# Patient Record
Sex: Male | Born: 1965 | Race: White | Hispanic: No | Marital: Married | Smoking: Never smoker
Health system: Southern US, Community
[De-identification: ages and names within clinical notes are randomized; demographics above are authoritative.]

## PROBLEM LIST (undated history)

## (undated) ENCOUNTER — Emergency Department (HOSPITAL_COMMUNITY): Admission: EM | Payer: No Typology Code available for payment source | Source: Home / Self Care

## (undated) DIAGNOSIS — Z9889 Other specified postprocedural states: Secondary | ICD-10-CM

## (undated) DIAGNOSIS — J349 Unspecified disorder of nose and nasal sinuses: Secondary | ICD-10-CM

## (undated) DIAGNOSIS — G479 Sleep disorder, unspecified: Secondary | ICD-10-CM

## (undated) DIAGNOSIS — R112 Nausea with vomiting, unspecified: Secondary | ICD-10-CM

## (undated) DIAGNOSIS — M1990 Unspecified osteoarthritis, unspecified site: Secondary | ICD-10-CM

## (undated) DIAGNOSIS — K219 Gastro-esophageal reflux disease without esophagitis: Secondary | ICD-10-CM

## (undated) HISTORY — PX: FINGER SURGERY: SHX640

## (undated) HISTORY — PX: HERNIA REPAIR: SHX51

---

## 2006-09-06 ENCOUNTER — Ambulatory Visit: Payer: Self-pay | Admitting: General Surgery

## 2009-02-11 ENCOUNTER — Emergency Department: Payer: Self-pay | Admitting: Unknown Physician Specialty

## 2011-05-04 ENCOUNTER — Ambulatory Visit: Payer: Self-pay | Admitting: Sports Medicine

## 2012-10-20 ENCOUNTER — Encounter (HOSPITAL_COMMUNITY): Payer: Self-pay | Admitting: Pharmacy Technician

## 2012-10-24 ENCOUNTER — Encounter (HOSPITAL_COMMUNITY): Payer: Self-pay

## 2012-10-24 ENCOUNTER — Encounter (HOSPITAL_COMMUNITY)
Admission: RE | Admit: 2012-10-24 | Discharge: 2012-10-24 | Disposition: A | Discharge status: Home / Self Care | Payer: No Typology Code available for payment source | Source: Ambulatory Visit | Attending: Orthopedic Surgery | Admitting: Orthopedic Surgery

## 2012-10-24 HISTORY — DX: Unspecified disorder of nose and nasal sinuses: J34.9

## 2012-10-24 HISTORY — DX: Gastro-esophageal reflux disease without esophagitis: K21.9

## 2012-10-24 HISTORY — DX: Sleep disorder, unspecified: G47.9

## 2012-10-24 HISTORY — DX: Unspecified osteoarthritis, unspecified site: M19.90

## 2012-10-24 HISTORY — DX: Nausea with vomiting, unspecified: R11.2

## 2012-10-24 HISTORY — DX: Other specified postprocedural states: Z98.890

## 2012-10-24 LAB — URINALYSIS, ROUTINE W REFLEX MICROSCOPIC
Bilirubin Urine: NEGATIVE
Glucose, UA: NEGATIVE mg/dL
Hgb urine dipstick: NEGATIVE
Ketones, ur: NEGATIVE mg/dL
Protein, ur: NEGATIVE mg/dL

## 2012-10-24 LAB — SURGICAL PCR SCREEN: MRSA, PCR: NEGATIVE

## 2012-10-24 LAB — APTT: aPTT: 30 seconds (ref 24–37)

## 2012-10-24 NOTE — Patient Instructions (Addendum)
Daniel Middleton  10/24/2012                           YOUR PROCEDURE IS SCHEDULED ON:  11/01/12               PLEASE REPORT TO SHORT STAY CENTER AT :  11:30 AM               CALL THIS NUMBER IF ANY PROBLEMS THE DAY OF SURGERY :               [phone]                      REMEMBER:   Do not eat food or drink liquids AFTER MIDNIGHT  May have clear liquids UNTIL 6 HOURS BEFORE SURGERY  (8:00 AM)  Clear liquids include soda, tea, black coffee, apple or grape juice, broth.  Take these medicines the morning of surgery with A SIP OF WATER:  NONE   Do not wear jewelry, make-up   Do not wear lotions, powders, or perfumes.   Do not shave legs or underarms 12 hrs. before surgery (men may shave face)  Do not bring valuables to the hospital.  Contacts, dentures or bridgework may not be worn into surgery.  Leave suitcase in the car. After surgery it may be brought to your room.  For patients admitted to the hospital more than one night, checkout time is 11:00                          The day of discharge.   Patients discharged the day of surgery will not be allowed to drive home                             If going home same day of surgery, must have someone stay with you first                           24 hrs at home and arrange for some one to drive you home from hospital.    Special Instructions:   Please read over the following fact sheets that you were given:               1. MRSA  INFORMATION                      2. [place] PREPARING FOR SURGERY SHEET                3. INCENTIVE SPIROMETER                                                X_____________________________________________________________________        Failure to follow these instructions may result in cancellation of your surgery

## 2012-10-25 NOTE — H&P (Signed)
TOTAL HIP ADMISSION H&P  Patient is admitted for left total hip arthroplasty, anterior approach.  Subjective:  Chief Complaint:  Left hip OA / pain  HPI: Daniel Middleton, 47 y.o. male, has a history of pain and functional disability in the left hip(s) due to arthritis and patient has failed non-surgical conservative treatments for greater than 12 weeks to include NSAID's and/or analgesics, corticosteriod injections and activity modification.  Onset of symptoms was gradual starting 3 years ago with gradually worsening course since that time.The patient noted no past surgery on the left hip(s).  Patient currently rates pain in the left hip at 8 out of 10 with activity. Patient has night pain, worsening of pain with activity and weight bearing, pain that interfers with activities of daily living and pain with passive range of motion. Patient has evidence of periarticular osteophytes and joint space narrowing by imaging studies. This condition presents safety issues increasing the risk of falls. There is no current active infection.  Risks, benefits and expectations were discussed with the patient. Patient understand the risks, benefits and expectations and wishes to proceed with surgery.   D/C Plans:   Home with HHPT  Post-op Meds:    Rx given for ASA, Robaxin, Iron, Colace and MiraLax  Tranexamic Acid:   To be given  Decadron:    To be given  FYI:   Nothing to note    Past Medical History  Diagnosis Date  . PONV (postoperative nausea and vomiting)   . Sinus trouble   . Arthritis   . GERD (gastroesophageal reflux disease)   . Difficulty sleeping     Past Surgical History  Procedure Date  . Hernia repair   . Finger surgery      No Known Allergies   History  Substance Use Topics  . Smoking status: Never Smoker   . Smokeless tobacco: Not on file  . Alcohol Use: Yes     Comment: rare       Review of Systems  Constitutional: Negative.   HENT: Negative.   Eyes: Negative.     Respiratory: Negative.   Cardiovascular: Negative.   Gastrointestinal: Positive for heartburn.  Genitourinary: Negative.   Musculoskeletal: Positive for joint pain.  Skin: Negative.   Neurological: Negative.   Endo/Heme/Allergies: Negative.   Psychiatric/Behavioral: Negative.     Objective:  Physical Exam  Constitutional: He is oriented to person, place, and time. He appears well-developed and well-nourished.  HENT:  Head: Normocephalic and atraumatic.  Mouth/Throat: Oropharynx is clear and moist.  Eyes: Pupils are equal, round, and reactive to light.  Neck: Neck supple. No JVD present. No tracheal deviation present. No thyromegaly present.  Cardiovascular: Normal rate, regular rhythm, normal heart sounds and intact distal pulses.   Respiratory: Effort normal and breath sounds normal. No stridor. No respiratory distress. He has no wheezes.  GI: Soft. There is no tenderness. There is no guarding.  Musculoskeletal:       Left hip: He exhibits decreased range of motion, decreased strength, tenderness and bony tenderness. He exhibits no swelling, no deformity and no laceration.  Lymphadenopathy:    He has no cervical adenopathy.  Neurological: He is alert and oriented to person, place, and time.  Skin: Skin is warm and dry.  Psychiatric: He has a normal mood and affect.     Imaging Review Plain radiographs demonstrate severe degenerative joint disease of the left hip(s). The bone quality appears to be good for age and reported activity level.  Assessment/Plan:  End stage arthritis, left hip(s)  The patient history, physical examination, clinical judgement of the provider and imaging studies are consistent with end stage degenerative joint disease of the left hip(s) and total hip arthroplasty is deemed medically necessary. The treatment options including medical management, injection therapy, arthroscopy and arthroplasty were discussed at length. The risks and benefits of total  hip arthroplasty were presented and reviewed. The risks due to aseptic loosening, infection, stiffness, dislocation/subluxation,  thromboembolic complications and other imponderables were discussed.  The patient acknowledged the explanation, agreed to proceed with the plan and consent was signed. Patient is being admitted for inpatient treatment for surgery, pain control, PT, OT, prophylactic antibiotics, VTE prophylaxis, progressive ambulation and ADL's and discharge planning.The patient is planning to be discharged home with home health services.    Daniel Middleton   PAC  10/25/2012, 6:57 PM

## 2012-10-26 NOTE — Progress Notes (Signed)
Pt notified of time change to 1:30 pm - to be at Short Stay at 11:00 am / clear liq until 7:30 am

## 2012-11-01 ENCOUNTER — Inpatient Hospital Stay (HOSPITAL_COMMUNITY)
Admission: RE | Admit: 2012-11-01 | Discharge: 2012-11-02 | DRG: 470 | Disposition: A | Discharge status: Home Health | Payer: No Typology Code available for payment source | Source: Ambulatory Visit | Attending: Orthopedic Surgery | Admitting: Orthopedic Surgery

## 2012-11-01 ENCOUNTER — Encounter (HOSPITAL_COMMUNITY): Payer: Self-pay | Admitting: Orthopedic Surgery

## 2012-11-01 ENCOUNTER — Inpatient Hospital Stay (HOSPITAL_COMMUNITY): Payer: No Typology Code available for payment source | Admitting: Anesthesiology

## 2012-11-01 ENCOUNTER — Encounter (HOSPITAL_COMMUNITY)
Admission: RE | Disposition: A | Discharge status: Home Health | Payer: Self-pay | Source: Ambulatory Visit | Attending: Orthopedic Surgery

## 2012-11-01 ENCOUNTER — Encounter (HOSPITAL_COMMUNITY): Payer: Self-pay | Admitting: Anesthesiology

## 2012-11-01 ENCOUNTER — Inpatient Hospital Stay (HOSPITAL_COMMUNITY): Payer: No Typology Code available for payment source

## 2012-11-01 ENCOUNTER — Encounter (HOSPITAL_COMMUNITY): Payer: Self-pay | Admitting: *Deleted

## 2012-11-01 DIAGNOSIS — Z01812 Encounter for preprocedural laboratory examination: Secondary | ICD-10-CM

## 2012-11-01 DIAGNOSIS — M1610 Unilateral primary osteoarthritis, unspecified hip: Principal | ICD-10-CM | POA: Diagnosis present

## 2012-11-01 DIAGNOSIS — Z96649 Presence of unspecified artificial hip joint: Secondary | ICD-10-CM

## 2012-11-01 DIAGNOSIS — M169 Osteoarthritis of hip, unspecified: Principal | ICD-10-CM | POA: Diagnosis present

## 2012-11-01 DIAGNOSIS — K219 Gastro-esophageal reflux disease without esophagitis: Secondary | ICD-10-CM | POA: Diagnosis present

## 2012-11-01 HISTORY — PX: TOTAL HIP ARTHROPLASTY: SHX124

## 2012-11-01 LAB — TYPE AND SCREEN: Antibody Screen: NEGATIVE

## 2012-11-01 SURGERY — ARTHROPLASTY, HIP, TOTAL, ANTERIOR APPROACH
Anesthesia: General | Site: Hip | Laterality: Left | Wound class: Clean

## 2012-11-01 MED ORDER — PROPOFOL 10 MG/ML IV BOLUS
INTRAVENOUS | Status: DC | PRN
  Administered 2012-11-01: 200 mg via INTRAVENOUS
  Administered 2012-11-01: 50 mg via INTRAVENOUS

## 2012-11-01 MED ORDER — NEOSTIGMINE METHYLSULFATE 1 MG/ML IJ SOLN
INTRAMUSCULAR | Status: DC | PRN
  Administered 2012-11-01: 4 mg via INTRAVENOUS

## 2012-11-01 MED ORDER — ONDANSETRON HCL 4 MG/2ML IJ SOLN: 4.0000 mg | Freq: Four times a day (QID) | INTRAMUSCULAR | Status: DC | PRN

## 2012-11-01 MED ORDER — ONDANSETRON HCL 4 MG/2ML IJ SOLN
INTRAMUSCULAR | Status: DC | PRN
  Administered 2012-11-01: 4 mg via INTRAVENOUS

## 2012-11-01 MED ORDER — MENTHOL 3 MG MT LOZG
1.0000 | LOZENGE | OROMUCOSAL | Status: DC | PRN
  Filled 2012-11-01 (×2): qty 9

## 2012-11-01 MED ORDER — FERROUS SULFATE 325 (65 FE) MG PO TABS
325.0000 mg | ORAL_TABLET | Freq: Three times a day (TID) | ORAL | Status: DC
  Administered 2012-11-02 (×2): 325 mg via ORAL
  Filled 2012-11-01 (×5): qty 1

## 2012-11-01 MED ORDER — DIPHENHYDRAMINE HCL 12.5 MG/5ML PO ELIX: 25.0000 mg | ORAL_SOLUTION | Freq: Four times a day (QID) | ORAL | Status: DC | PRN

## 2012-11-01 MED ORDER — LIDOCAINE HCL (CARDIAC) 20 MG/ML IV SOLN
INTRAVENOUS | Status: DC | PRN
  Administered 2012-11-01: 50 mg via INTRAVENOUS

## 2012-11-01 MED ORDER — CHLORHEXIDINE GLUCONATE 4 % EX LIQD: 60.0000 mL | Freq: Once | CUTANEOUS | Status: DC

## 2012-11-01 MED ORDER — ZOLPIDEM TARTRATE 5 MG PO TABS: 5.0000 mg | ORAL_TABLET | Freq: Every evening | ORAL | Status: DC | PRN

## 2012-11-01 MED ORDER — HYDROMORPHONE HCL PF 1 MG/ML IJ SOLN
0.2500 mg | INTRAMUSCULAR | Status: DC | PRN
  Administered 2012-11-01 (×4): 0.5 mg via INTRAVENOUS

## 2012-11-01 MED ORDER — PHENOL 1.4 % MT LIQD
1.0000 | OROMUCOSAL | Status: DC | PRN
  Filled 2012-11-01: qty 177

## 2012-11-01 MED ORDER — HYDROMORPHONE HCL PF 1 MG/ML IJ SOLN
0.5000 mg | INTRAMUSCULAR | Status: DC | PRN
  Administered 2012-11-01 (×2): 0.5 mg via INTRAVENOUS
  Filled 2012-11-01 (×2): qty 1

## 2012-11-01 MED ORDER — CEFAZOLIN SODIUM-DEXTROSE 2-3 GM-% IV SOLR
2.0000 g | INTRAVENOUS | Status: AC
  Administered 2012-11-01: 2 g via INTRAVENOUS

## 2012-11-01 MED ORDER — PROMETHAZINE HCL 25 MG/ML IJ SOLN: 6.2500 mg | INTRAMUSCULAR | Status: DC | PRN

## 2012-11-01 MED ORDER — SODIUM CHLORIDE 0.9 % IV SOLN
INTRAVENOUS | Status: DC
  Administered 2012-11-01 – 2012-11-02 (×2): via INTRAVENOUS
  Filled 2012-11-01 (×5): qty 1000

## 2012-11-01 MED ORDER — TRANEXAMIC ACID 100 MG/ML IV SOLN
1450.0000 mg | Freq: Once | INTRAVENOUS | Status: AC
  Administered 2012-11-01: 1449 mg via INTRAVENOUS
  Filled 2012-11-01: qty 14.5

## 2012-11-01 MED ORDER — LACTATED RINGERS IV SOLN
INTRAVENOUS | Status: DC | PRN
  Administered 2012-11-01 (×3): via INTRAVENOUS

## 2012-11-01 MED ORDER — DEXAMETHASONE SODIUM PHOSPHATE 10 MG/ML IJ SOLN
10.0000 mg | Freq: Once | INTRAMUSCULAR | Status: AC
  Administered 2012-11-02: 10 mg via INTRAVENOUS
  Filled 2012-11-01: qty 1

## 2012-11-01 MED ORDER — METHOCARBAMOL 500 MG PO TABS
500.0000 mg | ORAL_TABLET | Freq: Four times a day (QID) | ORAL | Status: DC | PRN
  Administered 2012-11-01 – 2012-11-02 (×2): 500 mg via ORAL
  Filled 2012-11-01 (×2): qty 1

## 2012-11-01 MED ORDER — ALUM & MAG HYDROXIDE-SIMETH 200-200-20 MG/5ML PO SUSP: 30.0000 mL | ORAL | Status: DC | PRN

## 2012-11-01 MED ORDER — METHOCARBAMOL 100 MG/ML IJ SOLN
500.0000 mg | Freq: Four times a day (QID) | INTRAVENOUS | Status: DC | PRN
  Administered 2012-11-01: 500 mg via INTRAVENOUS
  Filled 2012-11-01: qty 5

## 2012-11-01 MED ORDER — CEFAZOLIN SODIUM-DEXTROSE 2-3 GM-% IV SOLR
2.0000 g | Freq: Four times a day (QID) | INTRAVENOUS | Status: AC
  Administered 2012-11-01 (×2): 2 g via INTRAVENOUS
  Filled 2012-11-01 (×2): qty 50

## 2012-11-01 MED ORDER — HYDROMORPHONE HCL PF 1 MG/ML IJ SOLN: 0.2000 mg | INTRAMUSCULAR | Status: DC | PRN

## 2012-11-01 MED ORDER — METOCLOPRAMIDE HCL 5 MG/ML IJ SOLN
INTRAMUSCULAR | Status: DC | PRN
  Administered 2012-11-01: 10 mg via INTRAVENOUS

## 2012-11-01 MED ORDER — MIDAZOLAM HCL 5 MG/5ML IJ SOLN
INTRAMUSCULAR | Status: DC | PRN
  Administered 2012-11-01: 2 mg via INTRAVENOUS

## 2012-11-01 MED ORDER — ROCURONIUM BROMIDE 100 MG/10ML IV SOLN
INTRAVENOUS | Status: DC | PRN
  Administered 2012-11-01 (×2): 10 mg via INTRAVENOUS
  Administered 2012-11-01: 50 mg via INTRAVENOUS

## 2012-11-01 MED ORDER — LIDOCAINE HCL 4 % MT SOLN
OROMUCOSAL | Status: DC | PRN
  Administered 2012-11-01: 4 mL via TOPICAL

## 2012-11-01 MED ORDER — STERILE WATER FOR IRRIGATION IR SOLN
Status: DC | PRN
  Administered 2012-11-01: 3000 mL

## 2012-11-01 MED ORDER — ALPRAZOLAM 0.25 MG PO TABS: 0.2500 mg | ORAL_TABLET | Freq: Once | ORAL | Status: DC

## 2012-11-01 MED ORDER — METOCLOPRAMIDE HCL 5 MG/ML IJ SOLN: 10.0000 mg | Freq: Four times a day (QID) | INTRAMUSCULAR | Status: DC | PRN

## 2012-11-01 MED ORDER — RIVAROXABAN 10 MG PO TABS
10.0000 mg | ORAL_TABLET | Freq: Every day | ORAL | Status: DC
  Administered 2012-11-02: 10 mg via ORAL
  Filled 2012-11-01 (×3): qty 1

## 2012-11-01 MED ORDER — ONDANSETRON HCL 4 MG PO TABS: 4.0000 mg | ORAL_TABLET | Freq: Four times a day (QID) | ORAL | Status: DC | PRN

## 2012-11-01 MED ORDER — DEXAMETHASONE SODIUM PHOSPHATE 10 MG/ML IJ SOLN
INTRAMUSCULAR | Status: DC | PRN
  Administered 2012-11-01: 10 mg via INTRAVENOUS

## 2012-11-01 MED ORDER — HYDROMORPHONE HCL PF 1 MG/ML IJ SOLN
INTRAMUSCULAR | Status: DC | PRN
  Administered 2012-11-01: 1 mg via INTRAVENOUS

## 2012-11-01 MED ORDER — SUFENTANIL CITRATE 50 MCG/ML IV SOLN
INTRAVENOUS | Status: DC | PRN
  Administered 2012-11-01: 15 ug via INTRAVENOUS
  Administered 2012-11-01: 5 ug via INTRAVENOUS
  Administered 2012-11-01 (×2): 10 ug via INTRAVENOUS
  Administered 2012-11-01 (×2): 5 ug via INTRAVENOUS
  Administered 2012-11-01: 10 ug via INTRAVENOUS
  Administered 2012-11-01: 5 ug via INTRAVENOUS

## 2012-11-01 MED ORDER — HYDROCODONE-ACETAMINOPHEN 7.5-325 MG PO TABS
1.0000 | ORAL_TABLET | ORAL | Status: DC
  Administered 2012-11-01: 1 via ORAL
  Filled 2012-11-01: qty 2

## 2012-11-01 MED ORDER — DEXAMETHASONE SODIUM PHOSPHATE 10 MG/ML IJ SOLN: 10.0000 mg | Freq: Once | INTRAMUSCULAR | Status: DC

## 2012-11-01 MED ORDER — ACETAMINOPHEN 10 MG/ML IV SOLN
INTRAVENOUS | Status: DC | PRN
  Administered 2012-11-01: 1000 mg via INTRAVENOUS

## 2012-11-01 MED ORDER — OXYCODONE HCL 5 MG PO TABS
5.0000 mg | ORAL_TABLET | ORAL | Status: DC | PRN
  Administered 2012-11-01 – 2012-11-02 (×2): 10 mg via ORAL
  Filled 2012-11-01 (×2): qty 2

## 2012-11-01 MED ORDER — GLYCOPYRROLATE 0.2 MG/ML IJ SOLN
INTRAMUSCULAR | Status: DC | PRN
  Administered 2012-11-01: .6 mg via INTRAVENOUS

## 2012-11-01 MED ORDER — 0.9 % SODIUM CHLORIDE (POUR BTL) OPTIME
TOPICAL | Status: DC | PRN
  Administered 2012-11-01: 1000 mL

## 2012-11-01 SURGICAL SUPPLY — 37 items
BAG ZIPLOCK 12X15 (MISCELLANEOUS) ×4 IMPLANT
BLADE SAW SGTL 18X1.27X75 (BLADE) ×2 IMPLANT
CLOTH BEACON ORANGE TIMEOUT ST (SAFETY) ×2 IMPLANT
DERMABOND ADVANCED (GAUZE/BANDAGES/DRESSINGS) ×1
DERMABOND ADVANCED .7 DNX12 (GAUZE/BANDAGES/DRESSINGS) ×1 IMPLANT
DRAPE C-ARM 42X72 X-RAY (DRAPES) ×2 IMPLANT
DRAPE STERI IOBAN 125X83 (DRAPES) ×2 IMPLANT
DRAPE U-SHAPE 47X51 STRL (DRAPES) ×6 IMPLANT
DRSG AQUACEL AG ADV 3.5X10 (GAUZE/BANDAGES/DRESSINGS) ×2 IMPLANT
DRSG TEGADERM 4X4.75 (GAUZE/BANDAGES/DRESSINGS) ×2 IMPLANT
DURAPREP 26ML APPLICATOR (WOUND CARE) ×2 IMPLANT
ELECT BLADE TIP CTD 4 INCH (ELECTRODE) ×2 IMPLANT
ELECT REM PT RETURN 9FT ADLT (ELECTROSURGICAL) ×2
ELECTRODE REM PT RTRN 9FT ADLT (ELECTROSURGICAL) ×1 IMPLANT
EVACUATOR 1/8 PVC DRAIN (DRAIN) IMPLANT
FACESHIELD LNG OPTICON STERILE (SAFETY) ×8 IMPLANT
GAUZE SPONGE 2X2 8PLY STRL LF (GAUZE/BANDAGES/DRESSINGS) ×1 IMPLANT
GLOVE BIOGEL PI IND STRL 7.5 (GLOVE) ×1 IMPLANT
GLOVE BIOGEL PI IND STRL 8 (GLOVE) ×1 IMPLANT
GLOVE BIOGEL PI INDICATOR 7.5 (GLOVE) ×1
GLOVE BIOGEL PI INDICATOR 8 (GLOVE) ×1
GLOVE ECLIPSE 8.0 STRL XLNG CF (GLOVE) ×2 IMPLANT
GLOVE ORTHO TXT STRL SZ7.5 (GLOVE) ×4 IMPLANT
GOWN BRE IMP PREV XXLGXLNG (GOWN DISPOSABLE) ×4 IMPLANT
GOWN STRL NON-REIN LRG LVL3 (GOWN DISPOSABLE) ×2 IMPLANT
KIT BASIN OR (CUSTOM PROCEDURE TRAY) ×2 IMPLANT
PACK TOTAL JOINT (CUSTOM PROCEDURE TRAY) ×2 IMPLANT
PADDING CAST COTTON 6X4 STRL (CAST SUPPLIES) ×2 IMPLANT
SPONGE GAUZE 2X2 STER 10/PKG (GAUZE/BANDAGES/DRESSINGS) ×1
SUCTION FRAZIER 12FR DISP (SUCTIONS) ×2 IMPLANT
SUT MNCRL AB 4-0 PS2 18 (SUTURE) ×2 IMPLANT
SUT VIC AB 1 CT1 36 (SUTURE) ×8 IMPLANT
SUT VIC AB 2-0 CT1 27 (SUTURE) ×2
SUT VIC AB 2-0 CT1 TAPERPNT 27 (SUTURE) ×2 IMPLANT
SUT VLOC 180 0 24IN GS25 (SUTURE) ×2 IMPLANT
TOWEL OR 17X26 10 PK STRL BLUE (TOWEL DISPOSABLE) ×4 IMPLANT
TRAY FOLEY CATH 14FRSI W/METER (CATHETERS) ×2 IMPLANT

## 2012-11-01 NOTE — Anesthesia Postprocedure Evaluation (Signed)
  Anesthesia Post-op Note  Patient: Daniel Middleton  Procedure(s) Performed: Procedure(s) (LRB): TOTAL HIP ARTHROPLASTY ANTERIOR APPROACH (Left)  Patient Location: PACU  Anesthesia Type: General  Level of Consciousness: awake and alert   Airway and Oxygen Therapy: Patient Spontanous Breathing  Post-op Pain: mild  Post-op Assessment: Post-op Vital signs reviewed, Patient's Cardiovascular Status Stable, Respiratory Function Stable, Patent Airway and No signs of Nausea or vomiting  Last Vitals:  Filed Vitals:   11/01/12 1230  BP: 115/78  Pulse: 79  Temp:   Resp: 10    Post-op Vital Signs: stable   Complications: No apparent anesthesia complications

## 2012-11-01 NOTE — Op Note (Signed)
NAME:  Daniel Middleton                ACCOUNT NO.: [id number]      MEDICAL RECORD NO.: [id number]      FACILITY:  [facility]      PHYSICIAN:  Durene Romans D  DATE OF BIRTH:  01-May-1966     DATE OF PROCEDURE:  11/01/2012                                 OPERATIVE REPORT         PREOPERATIVE DIAGNOSIS: Left  hip osteoarthritis.      POSTOPERATIVE DIAGNOSIS:  Left hip osteoarthritis.      PROCEDURE:  Left total hip replacement through an anterior approach   utilizing DePuy THR system, component size 54mm pinnacle cup, a size 36+4 neutral   Altrex liner, a size 6 Hi Tri Lock stem with a 36+1.5 delta ceramic   ball.      SURGEON:  Madlyn Frankel. Charlann Boxer, M.D.      ASSISTANT:  Leilani Able, PA-C      ANESTHESIA:  General.      SPECIMENS:  None.      COMPLICATIONS:  None.      BLOOD LOSS:  500 cc     DRAINS:  One Hemovac.      INDICATION OF THE PROCEDURE:  Daniel Middleton is a 47 y.o. male who had   presented to office for evaluation of left hip pain.  Radiographs revealed   progressive degenerative changes with bone-on-bone   articulation to the  hip joint.  The patient had painful limited range of   motion significantly affecting their overall quality of life.  The patient was failing to    respond to conservative measures, and at this point was ready   to proceed with more definitive measures.  The patient has noted progressive   degenerative changes in his hip, progressive problems and dysfunction   with regarding the hip prior to surgery.  Consent was obtained for   benefit of pain relief.  Specific risk of infection, DVT, component   failure, dislocation, need for revision surgery, as well discussion of   the anterior versus posterior approach were reviewed.  Consent was   obtained for benefit of anterior pain relief through an anterior   approach.      PROCEDURE IN DETAIL:  The patient was brought to operative theater.   Once adequate anesthesia,  preoperative antibiotics, 2gm Ancef administered.   The patient was positioned supine on the OSI Hanna table.  Once adequate   padding of boney process was carried out, we had predraped out the hip, and  used fluoroscopy to confirm orientation of the pelvis and position.      The left hip was then prepped and draped from proximal iliac crest to   mid thigh with shower curtain technique.      Time-out was performed identifying the patient, planned procedure, and   extremity.     An incision was then made 2 cm distal and lateral to the   anterior superior iliac spine extending over the orientation of the   tensor fascia lata muscle and sharp dissection was carried down to the   fascia of the muscle and protractor placed in the soft tissues.      The fascia was then incised.  The muscle belly was identified and swept  laterally and retractor placed along the superior neck.  Following   cauterization of the circumflex vessels and removing some pericapsular   fat, a second cobra retractor was placed on the inferior neck.  A third   retractor was placed on the anterior acetabulum after elevating the   anterior rectus.  A L-capsulotomy was along the line of the   superior neck to the trochanteric fossa, then extended proximally and   distally.  Tag sutures were placed and the retractors were then placed   intracapsular.  We then identified the trochanteric fossa and   orientation of my neck cut, confirmed this radiographically   and then made a neck osteotomy with the femur on traction.  The femoral   head was removed without difficulty or complication.  Traction was let   off and retractors were placed posterior and anterior around the   acetabulum.      The labrum and foveal tissue were debrided.  I began reaming with a 47mm   reamer and reamed up to 53mm reamer with good bony bed preparation and a 54   cup was chosen.  The final 54mm Pinnacle cup was then impacted under fluoroscopy  to  confirm the depth of penetration and orientation with respect to   abduction.  A screw was placed followed by the hole eliminator.  The final   36+4 neutral Altrex liner was impacted with good visualized rim fit.  The cup was positioned anatomically within the acetabular portion of the pelvis.      At this point, the femur was rolled at 80 degrees.  Further capsule was   released off the inferior aspect of the femoral neck.  I then   released the superior capsule proximally.  The hook was placed laterally   along the femur and elevated manually and held in position with the bed   hook.  The leg was then extended and adducted with the leg rolled to 100   degrees of external rotation.  Once the proximal femur was fully   exposed, I used a box osteotome to set orientation.  I then began   broaching with the starting chili pepper broach and passed this by hand and then broached up to 6.  With the 6 broach in place I chose a high offset neck and did a trial reduction with the 36+1.5.  The offset was appropriate, leg lengths   appeared to be equal, confirmed radiographically.   Given these findings, I went ahead and dislocated the hip, repositioned all   retractors and positioned the right hip in the extended and abducted position.  The final 6 Hi Tri Lock stem was   chosen and it was impacted down to the level of neck cut.  Based on this   and the trial reduction, a 36+1.5 delta ceramic ball was chosen and   impacted onto a clean and dry trunnion, and the hip was reduced.  The   hip had been irrigated throughout the case again at this point.  I did   reapproximate the superior capsular leaflet to the anterior leaflet   using #1 Vicryl, placed a medium Hemovac drain deep.  The fascia of the   tensor fascia lata muscle was then reapproximated using #1 Vicryl.  The   remaining wound was closed with 2-0 Vicryl and running 4-0 Monocryl.   The hip was cleaned, dried, and dressed sterilely using  Dermabond and   Aquacel dressing.  Drain site dressed separately.  She was then brought   to recovery room in stable condition tolerating the procedure well.    Leilani Able, PA-C was present for the entirety of the case involved from   preoperative positioning, perioperative retractor management, general   facilitation of the case, as well as primary wound closure as assistant.            Madlyn Frankel Charlann Boxer, M.D.            MDO/MEDQ  D:  07/28/2011  T:  07/28/2011  Job:  [id number]      Electronically Signed by Durene Romans M.D. on 08/03/2011 09:15:38 AM

## 2012-11-01 NOTE — Anesthesia Preprocedure Evaluation (Addendum)
Anesthesia Evaluation  Patient identified by MRN, date of birth, ID band Patient awake    Reviewed: Allergy & Precautions, H&P , NPO status , Patient's Chart, lab work & pertinent test results, reviewed documented beta blocker date and time   History of Anesthesia Complications (+) PONV  Airway Mallampati: II TM Distance: >3 FB Neck ROM: full    Dental No notable dental hx.    Pulmonary neg pulmonary ROS,  breath sounds clear to auscultation  Pulmonary exam normal       Cardiovascular Exercise Tolerance: Good negative cardio ROS  Rhythm:regular Rate:Normal     Neuro/Psych negative neurological ROS  negative psych ROS   GI/Hepatic Neg liver ROS, GERD-  ,  Endo/Other  negative endocrine ROS  Renal/GU negative Renal ROS  negative genitourinary   Musculoskeletal   Abdominal   Peds  Hematology negative hematology ROS (+)   Anesthesia Other Findings   Reproductive/Obstetrics negative OB ROS                           Anesthesia Physical Anesthesia Plan  ASA: II  Anesthesia Plan: General   Post-op Pain Management:    Induction:   Airway Management Planned: Oral ETT  Additional Equipment:   Intra-op Plan:   Post-operative Plan:   Informed Consent: I have reviewed the patients History and Physical, chart, labs and discussed the procedure including the risks, benefits and alternatives for the proposed anesthesia with the patient or authorized representative who has indicated his/her understanding and acceptance.   Dental Advisory Given  Plan Discussed with: CRNA  Anesthesia Plan Comments: (Discussed r/b general versus spinal. Patient prefers general.)       Anesthesia Quick Evaluation

## 2012-11-01 NOTE — Transfer of Care (Signed)
Immediate Anesthesia Transfer of Care Note  Patient: Daniel Middleton  Procedure(s) Performed: Procedure(s) (LRB) with comments: TOTAL HIP ARTHROPLASTY ANTERIOR APPROACH (Left)  Patient Location: PACU  Anesthesia Type:General  Level of Consciousness: awake, oriented, sedated and patient cooperative  Airway & Oxygen Therapy: Patient Spontanous Breathing and Patient connected to face mask oxygen  Post-op Assessment: Report given to PACU RN, Post -op Vital signs reviewed and stable and Patient moving all extremities X 4  Post vital signs: stable  Complications: No apparent anesthesia complications

## 2012-11-01 NOTE — Interval H&P Note (Signed)
History and Physical Interval Note:  11/01/2012 8:12 AM  Daniel Middleton  has presented today for surgery, with the diagnosis of Left Hip Osteoarthritis  The various methods of treatment have been discussed with the patient and family. After consideration of risks, benefits and other options for treatment, the patient has consented to  Procedure(s) (LRB) with comments: TOTAL HIP ARTHROPLASTY ANTERIOR APPROACH (Left) as a surgical intervention .  The patient's history has been reviewed, patient examined, no change in status, stable for surgery.  I have reviewed the patient's chart and labs.  Questions were answered to the patient's satisfaction.     Shelda Pal

## 2012-11-02 ENCOUNTER — Encounter (HOSPITAL_COMMUNITY): Payer: Self-pay | Admitting: Orthopedic Surgery

## 2012-11-02 LAB — BASIC METABOLIC PANEL
BUN: 12 mg/dL (ref 6–23)
CO2: 25 mEq/L (ref 19–32)
Chloride: 101 mEq/L (ref 96–112)
Creatinine, Ser: 0.73 mg/dL (ref 0.50–1.35)
Glucose, Bld: 138 mg/dL — ABNORMAL HIGH (ref 70–99)

## 2012-11-02 LAB — CBC
HCT: 33.7 % — ABNORMAL LOW (ref 39.0–52.0)
MCV: 87.3 fL (ref 78.0–100.0)
RBC: 3.86 MIL/uL — ABNORMAL LOW (ref 4.22–5.81)
RDW: 12.2 % (ref 11.5–15.5)
WBC: 12.6 10*3/uL — ABNORMAL HIGH (ref 4.0–10.5)

## 2012-11-02 MED ORDER — HYDROCODONE-ACETAMINOPHEN 7.5-325 MG PO TABS: 1.0000 | ORAL_TABLET | ORAL | Status: AC | PRN

## 2012-11-02 MED ORDER — POLYETHYLENE GLYCOL 3350 17 GM/SCOOP PO POWD: 17.0000 g | Freq: Two times a day (BID) | ORAL | Status: AC

## 2012-11-02 MED ORDER — HYDROCODONE-ACETAMINOPHEN 7.5-325 MG PO TABS
1.0000 | ORAL_TABLET | ORAL | Status: DC | PRN
  Administered 2012-11-02: 2 via ORAL
  Filled 2012-11-02: qty 2

## 2012-11-02 MED ORDER — METHOCARBAMOL 500 MG PO TABS: 500.0000 mg | ORAL_TABLET | Freq: Four times a day (QID) | ORAL | Status: AC | PRN

## 2012-11-02 MED ORDER — FERROUS SULFATE 325 (65 FE) MG PO TABS: 325.0000 mg | ORAL_TABLET | Freq: Three times a day (TID) | ORAL | Status: AC

## 2012-11-02 MED ORDER — ASPIRIN EC 325 MG PO TBEC: 325.0000 mg | DELAYED_RELEASE_TABLET | Freq: Two times a day (BID) | ORAL | Status: AC

## 2012-11-02 MED ORDER — DOCUSATE SODIUM 100 MG PO CAPS: 100.0000 mg | ORAL_CAPSULE | Freq: Two times a day (BID) | ORAL | Status: AC

## 2012-11-02 NOTE — Progress Notes (Signed)
Pt was stable at time of discharge. No changes from shift assessment. Reviewed discharge education with pt and husband. Answered questions. Removed IV. Pt was assisted by wheel chair when she left.

## 2012-11-02 NOTE — Progress Notes (Signed)
Pt was stable at time of discharge with no changes in health status from shift assessment. Removed IV. Reviewed discharge education with pt and wife. Gave them discharge packet and prescription. Pt and wife did not have questions after discharge education.

## 2012-11-02 NOTE — Care Management Note (Signed)
    Page 1 of 2   11/02/2012     2:46:33 PM   CARE MANAGEMENT NOTE 11/02/2012  Patient:  Daniel Middleton, Daniel Middleton   Account Number:  [id number]  Date Initiated:  11/02/2012  Documentation initiated by:  Colleen Can  Subjective/Objective Assessment:   DX Left hip osteoarthritis; total hip replacement-anterior approach     Action/Plan:   CM spoke with patient. Plans are for him to return to his home in [place] where spouse will be caregiver. Pt already has DME which includes RW.  Wants HH agency in network.   Anticipated DC Date:  11/02/2012   Anticipated DC Plan:  HOME W HOME HEALTH SERVICES  In-house referral  NA      DC Planning Services  CM consult      [facility] Choice  HOME HEALTH   Choice offered to / List presented to:  C-1 Patient   DME arranged  NA      DME agency  NA     HH arranged  HH-2 PT      HH agency  [facility]   Status of service:  Completed, signed off Medicare Important Message given?  NO (If response is "NO", the following Medicare IM given date fields will be blank) Date Medicare IM given:   Date Additional Medicare IM given:    Discharge Disposition:  HOME W HOME HEALTH SERVICES  Per UR Regulation:  Reviewed for med. necessity/level of care/duration of stay  If discussed at Long Length of Stay Meetings, dates discussed:    Comments:  11/02/2012 Dory Peru RN CCM [phone] CM spoke with [facility] rep who states they can provide HHpt for patient with start date of tomorrow.

## 2012-11-02 NOTE — Progress Notes (Signed)
Patient ID: Daniel Middleton, male   DOB: [birth year], 47 y.o.   MRN: [id number] Subjective: 1 Day Post-Op Procedure(s) (LRB): TOTAL HIP ARTHROPLASTY ANTERIOR APPROACH (Left)    Patient reports pain as mild.  Reports anxiety as his primary concern  Objective:   VITALS:   Filed Vitals:   11/02/12 0800  BP:   Pulse:   Temp:   Resp: 11    Neurovascular intact Incision: dressing C/D/I HV drain removed  LABS  Basename 11/02/12 0410  HGB 11.9*  HCT 33.7*  WBC 12.6*  PLT 182     Basename 11/02/12 0410  NA 136  K 4.1  BUN 12  CREATININE 0.73  GLUCOSE 138*    No results found for this basename: LABPT:2,INR:2 in the last 72 hours   Assessment/Plan: 1 Day Post-Op Procedure(s) (LRB): TOTAL HIP ARTHROPLASTY ANTERIOR APPROACH (Left)   Advance diet Up with therapy Discharge home with home health  Changed pain meds to help with sedation as well as allow him to tolerate post-operative activity today Home after 2 sessions with therapy

## 2012-11-02 NOTE — Progress Notes (Signed)
Physical Therapy Treatment Patient Details Name: Daniel Middleton MRN: [id number] DOB: 1966/02/01 Today's Date: 11/02/2012 Time: 7829-5621 PT Time Calculation (min): 31 min  PT Assessment / Plan / Recommendation Comments on Treatment Session  pt doing well; wants to go home    Follow Up Recommendations  Home health PT     Does the patient have the potential to tolerate intense rehabilitation     Barriers to Discharge        Equipment Recommendations  None recommended by PT    Recommendations for Other Services    Frequency 7X/week   Plan Discharge plan remains appropriate;Frequency remains appropriate    Precautions / Restrictions Precautions Precautions: None Restrictions LLE Weight Bearing: Weight bearing as tolerated   Pertinent Vitals/Pain     Mobility  Transfers Transfers: Sit to Stand;Stand to Sit Sit to Stand: 5: Supervision Stand to Sit: 5: Supervision Details for Transfer Assistance: cues for hand placement Ambulation/Gait Ambulation/Gait Assistance: 5: Supervision Ambulation Distance (Feet): 140 Feet Assistive device: Crutches Ambulation/Gait Assistance Details: cues for  sequence and crutch placement Gait Pattern: Step-to pattern;Step-through pattern General Gait Details: slight vaulting on RLE, pt did this as compensatory technique prior to surgery Stairs: Yes Stairs Assistance: 4: Min guard Stair Management Technique: No rails;Forwards;With crutches;Step to pattern Number of Stairs: 2     Exercises Total Joint Exercises Ankle Circles/Pumps: AROM;Both;10 reps Quad Sets: AROM;Both;5 reps Heel Slides: AROM;AAROM;Left;10 reps (pt instr self assist with sheet)   PT Diagnosis: Difficulty walking  PT Problem List: Decreased strength;Decreased knowledge of use of DME;Decreased range of motion;Decreased activity tolerance;Decreased mobility PT Treatment Interventions: DME instruction;Gait training;Stair training;Functional mobility training;Therapeutic  activities;Therapeutic exercise   PT Goals Acute Rehab PT Goals PT Goal Formulation: With patient Time For Goal Achievement: 11/09/12 Potential to Achieve Goals: Good Pt will go Supine/Side to Sit: with supervision PT Goal: Supine/Side to Sit - Progress: Goal set today Pt will go Sit to Supine/Side: with supervision PT Goal: Sit to Supine/Side - Progress: Goal set today Pt will go Sit to Stand: with supervision PT Goal: Sit to Stand - Progress: Met Pt will go Stand to Sit: with supervision PT Goal: Stand to Sit - Progress: Met Pt will Ambulate: 16 - 50 feet;with supervision;with least restrictive assistive device PT Goal: Ambulate - Progress: Met Pt will Go Up / Down Stairs: 1-2 stairs;with least restrictive assistive device;with min assist PT Goal: Up/Down Stairs - Progress: Met  Visit Information  Last PT Received On: 11/02/12 Assistance Needed: +1    Subjective Data  Subjective: when am I going home? Patient Stated Goal: home today   Cognition  Overall Cognitive Status: Appears within functional limits for tasks assessed/performed Arousal/Alertness: Awake/alert Orientation Level: Appears intact for tasks assessed Behavior During Session: [facility] for tasks performed    Balance     End of Session PT - End of Session Activity Tolerance: Patient tolerated treatment well Patient left: in chair;with call bell/phone within reach;with family/visitor present Nurse Communication: Mobility status   GP     [facility] 11/02/2012, 12:54 PM

## 2012-11-02 NOTE — Evaluation (Signed)
Physical Therapy Evaluation Patient Details Name: Daniel Middleton MRN: [id number] DOB: Jul 22, 1966 Today's Date: 11/02/2012 Time: 6962-9528 PT Time Calculation (min): 26 min  PT Assessment / Plan / Recommendation Clinical Impression  pt is s/p left Anterior THA and will benefit form Pt tomaximize safety and independence for home with wife support    PT Assessment  Patient needs continued PT services    Follow Up Recommendations  Home health PT    Does the patient have the potential to tolerate intense rehabilitation      Barriers to Discharge        Equipment Recommendations  None recommended by PT    Recommendations for Other Services     Frequency 7X/week    Precautions / Restrictions Precautions Precautions: None Restrictions Weight Bearing Restrictions: Yes LLE Weight Bearing: Weight bearing as tolerated   Pertinent Vitals/Pain sats 100% on RA      Mobility  Transfers Transfers: Sit to Stand;Stand to Sit Sit to Stand: 4: Min guard Stand to Sit: 4: Min guard Details for Transfer Assistance: cues for hand placement Ambulation/Gait Ambulation/Gait Assistance: 4: Min guard Ambulation Distance (Feet): 120 Feet Assistive device: Rolling walker Ambulation/Gait Assistance Details: cues for hand placement Gait Pattern: Step-to pattern;Antalgic    Shoulder Instructions     Exercises Total Joint Exercises Ankle Circles/Pumps: AROM;Both;5 reps Quad Sets: AROM;Both;5 reps   PT Diagnosis: Difficulty walking  PT Problem List: Decreased strength;Decreased knowledge of use of DME;Decreased range of motion;Decreased activity tolerance;Decreased mobility PT Treatment Interventions: DME instruction;Gait training;Stair training;Functional mobility training;Therapeutic activities;Therapeutic exercise   PT Goals Acute Rehab PT Goals PT Goal Formulation: With patient Time For Goal Achievement: 11/09/12 Potential to Achieve Goals: Good Pt will go Supine/Side to Sit: with  supervision PT Goal: Supine/Side to Sit - Progress: Goal set today Pt will go Sit to Supine/Side: with supervision PT Goal: Sit to Supine/Side - Progress: Goal set today Pt will go Sit to Stand: with supervision PT Goal: Sit to Stand - Progress: Goal set today Pt will go Stand to Sit: with supervision PT Goal: Stand to Sit - Progress: Goal set today Pt will Ambulate: 16 - 50 feet;with supervision;with least restrictive assistive device PT Goal: Ambulate - Progress: Goal set today Pt will Go Up / Down Stairs: 1-2 stairs;with least restrictive assistive device PT Goal: Up/Down Stairs - Progress: Goal set today  Visit Information  Last PT Received On: 11/02/12 Assistance Needed: +1    Subjective Data  Subjective: pt up in chair Patient Stated Goal: home today   Prior Functioning  Home Living Lives With: Family Available Help at Discharge: Family Type of Home: House Home Access: Stairs to enter Secretary/administrator of Steps: 2 Entrance Stairs-Rails: None Home Layout: Multi-level;Able to live on main level with bedroom/bathroom Bathroom Shower/Tub: Engineer, manufacturing systems: Standard Home Adaptive Equipment: Bedside commode/3-in-1;Straight cane;Walker - rolling Prior Function Level of Independence: Independent Able to Take Stairs?: Yes Driving: Yes Vocation: Full time employment Communication Communication: No difficulties Dominant Hand: Right    Cognition  Overall Cognitive Status: Appears within functional limits for tasks assessed/performed Arousal/Alertness: Awake/alert Orientation Level: Appears intact for tasks assessed Behavior During Session: [facility] for tasks performed    Extremity/Trunk Assessment Right Upper Extremity Assessment RUE ROM/Strength/Tone: [facility] for tasks assessed Left Upper Extremity Assessment LUE ROM/Strength/Tone: WFL for tasks assessed Right Lower Extremity Assessment RLE ROM/Strength/Tone: [facility] for tasks assessed Left Lower Extremity  Assessment LLE ROM/Strength/Tone: Deficits LLE ROM/Strength/Tone Deficits: hip flexion at least 2+/5; knee grossly WFL; ankle [facility]  Trunk Assessment Trunk Assessment: Normal   Balance    End of Session PT - End of Session Activity Tolerance: Patient tolerated treatment well Patient left: in chair;with call bell/phone within reach Nurse Communication: Mobility status  GP     [facility] 11/02/2012, 11:19 AM

## 2012-11-02 NOTE — Evaluation (Signed)
Occupational Therapy Evaluation Patient Details Name: Daniel Middleton MRN: [id number] DOB: July 22, 1966 Today's Date: 11/02/2012 Time: 9562-1308 OT Time Calculation (min): 27 min  OT Assessment / Plan / Recommendation Clinical Impression  Pt doing well POD 1 L THR anterior approach. All education completed. Pt will have necessary level of A from family upon d/c.    OT Assessment  Patient does not need any further OT services    Follow Up Recommendations  No OT follow up    Barriers to Discharge      Equipment Recommendations  None recommended by OT    Recommendations for Other Services    Frequency       Precautions / Restrictions Precautions Precautions: None Restrictions Weight Bearing Restrictions: Yes LLE Weight Bearing: Weight bearing as tolerated   Pertinent Vitals/Pain Pt reported 7/10 pain following session. Repositioned in chair.    ADL  Grooming: Min guard Where Assessed - Grooming: Supported standing Upper Body Bathing: Set up Where Assessed - Upper Body Bathing: Unsupported sitting Lower Body Bathing: Minimal assistance Where Assessed - Lower Body Bathing: Supported sit to stand Upper Body Dressing: Set up Where Assessed - Upper Body Dressing: Unsupported sitting Lower Body Dressing: Minimal assistance Where Assessed - Lower Body Dressing: Supported sit to stand Toilet Transfer: Min Pension scheme manager Method: Sit to Barista: Raised toilet seat with arms (or 3-in-1 over toilet) Toileting - Clothing Manipulation and Hygiene: Min guard Where Assessed - Toileting Clothing Manipulation and Hygiene: Sit to stand from 3-in-1 or toilet Transfers/Ambulation Related to ADLs: Pt ambulated to the bathroom with minguard A.  ADL Comments: Pt stated he would not be attempting to step over tub unless he had assistance. Educated on basic technique for LB ADLs. Pt stated he would have help from father in law.    OT Diagnosis:    OT Problem List:     OT Treatment Interventions:     OT Goals    Visit Information  Last OT Received On: 11/02/12 Assistance Needed: +1 PT/OT Co-Evaluation/Treatment: Yes    Subjective Data  Subjective: I'm hurting. Patient Stated Goal: Not asked.   Prior Functioning     Home Living Lives With: Family Available Help at Discharge: Family Type of Home: House Home Access: Stairs to enter [employer] of Steps: 2 Entrance Stairs-Rails: None Home Layout: Multi-level;Able to live on main level with bedroom/bathroom Bathroom Shower/Tub: Engineer, manufacturing systems: Standard Home Adaptive Equipment: Bedside commode/3-in-1;Straight cane;Walker - rolling Prior Function Level of Independence: Independent Able to Take Stairs?: Yes Driving: Yes Vocation: Full time employment Communication Communication: No difficulties Dominant Hand: Right         Vision/Perception     Cognition  Overall Cognitive Status: Appears within functional limits for tasks assessed/performed Arousal/Alertness: Awake/alert Orientation Level: Appears intact for tasks assessed Behavior During Session: [facility] for tasks performed    Extremity/Trunk Assessment Right Upper Extremity Assessment RUE ROM/Strength/Tone: [facility] for tasks assessed Left Upper Extremity Assessment LUE ROM/Strength/Tone: WFL for tasks assessed Right Lower Extremity Assessment RLE ROM/Strength/Tone: [facility] for tasks assessed Left Lower Extremity Assessment LLE ROM/Strength/Tone: Deficits LLE ROM/Strength/Tone Deficits: hip flexion at least 2+/5; knee grossly WFL; ankle WFL Trunk Assessment Trunk Assessment: Normal     Mobility Transfers Sit to Stand: 4: Min guard Stand to Sit: 4: Min guard Details for Transfer Assistance: cues for hand placement     Shoulder Instructions     Exercise    Balance     End of Session OT - End  of Session Activity Tolerance: Patient tolerated treatment well Patient left: in chair;with call  bell/phone within reach  GO     Daniel Middleton A OTR/L [phone] 11/02/2012, 11:22 AM

## 2012-11-04 NOTE — Discharge Summary (Signed)
Physician Discharge Summary  Patient ID: Daniel Middleton MRN: [id number] DOB/AGE: 10/17/1965 47 y.o.  Admit date: 11/01/2012 Discharge date: 11/02/2012   Procedures:  Procedure(s) (LRB): TOTAL HIP ARTHROPLASTY ANTERIOR APPROACH (Left)  Attending Physician:  Dr. Durene Romans   Admission Diagnoses:   Left hip OA / pain  Discharge Diagnoses:  Principal Problem:  *S/P left THA, AA  . PONV (postoperative nausea and vomiting)  . Sinus trouble  . Arthritis  . GERD (gastroesophageal reflux disease)  . Difficulty sleeping    HPI: Daniel Middleton, 47 y.o. male, has a history of pain and functional disability in the left hip(s) due to arthritis and patient has failed non-surgical conservative treatments for greater than 12 weeks to include NSAID's and/or analgesics, corticosteriod injections and activity modification. Onset of symptoms was gradual starting 3 years ago with gradually worsening course since that time.The patient noted no past surgery on the left hip(s). Patient currently rates pain in the left hip at 8 out of 10 with activity. Patient has night pain, worsening of pain with activity and weight bearing, pain that interfers with activities of daily living and pain with passive range of motion. Patient has evidence of periarticular osteophytes and joint space narrowing by imaging studies. This condition presents safety issues increasing the risk of falls. There is no current active infection. Risks, benefits and expectations were discussed with the patient. Patient understand the risks, benefits and expectations and wishes to proceed with surgery.   PCP:    Discharged Condition: good  Hospital Course:  Patient underwent the above stated procedure on 11/01/2012. Patient tolerated the procedure well and brought to the recovery room in good condition and subsequently to the floor.  POD #1 BP: 120/71 ; Pulse: 86 ; Temp: 97.8 F (36.6 C) ; Resp: 16 Pt's foley was removed, as well as  the hemovac drain removed. IV was changed to a saline lock. Patient reports pain as mild. Reports anxiety as his primary concern Neurovascular intact, dorsiflexion/plantar flexion intact, incision: dressing C/D/I, no cellulitis present and compartment soft.   LABS  Basename  11/02/12 0410   HGB  11.9  HCT  33.7    Discharge Exam: General appearance: alert, cooperative and no distress Extremities: Homans sign is negative, no sign of DVT, no edema, redness or tenderness in the calves or thighs and no ulcers, gangrene or trophic changes  Disposition:   Home with follow up in 2 weeks   Follow-up Information    Follow up with Shelda Pal, MD. Schedule an appointment as soon as possible for a visit in 2 weeks.   Contact information:   [address] [place] [address] [phone]          Discharge Orders    Future Orders Please Complete By Expires   Diet - low sodium heart healthy      Call MD / Call 911      Comments:   If you experience chest pain or shortness of breath, CALL 911 and be transported to the hospital emergency room.  If you develope a fever above 101 F, pus (white drainage) or increased drainage or redness at the wound, or calf pain, call your surgeon's office.   Discharge instructions      Comments:   Maintain surgical dressing for 10-14 days, then replace with gauze and tape. Keep the area dry and clean until follow up. Follow up in 2 weeks at [facility]. Call with any questions or concerns.  Constipation Prevention      Comments:   Drink plenty of fluids.  Prune juice may be helpful.  You may use a stool softener, such as Colace (over the counter) 100 mg twice a day.  Use MiraLax (over the counter) for constipation as needed.   Increase activity slowly as tolerated      Weight bearing as tolerated      Change dressing      Comments:   Maintain surgical dressing for 10-14 days, then replace with 4x4 guaze and tape. Keep the area  dry and clean.   TED hose      Comments:   Use stockings (TED hose) for 2 weeks on both leg(s).  You may remove them at night for sleeping.      Discharge Medication List as of 11/02/2012 11:51 AM    START taking these medications   Details  aspirin EC 325 MG tablet Take 1 tablet (325 mg total) by mouth 2 (two) times daily., Starting 11/02/2012, Until Discontinued, No Print    docusate sodium (COLACE) 100 MG capsule Take 1 capsule (100 mg total) by mouth 2 (two) times daily., Starting 11/02/2012, Until Discontinued, No Print    ferrous sulfate 325 (65 FE) MG tablet Take 1 tablet (325 mg total) by mouth 3 (three) times daily after meals., Starting 11/02/2012, Until Discontinued, No Print    HYDROcodone-acetaminophen (NORCO) 7.5-325 MG per tablet Take 1-2 tablets by mouth every 4 (four) hours as needed for pain., Starting 11/02/2012, Until Discontinued, Print    methocarbamol (ROBAXIN) 500 MG tablet Take 1 tablet (500 mg total) by mouth every 6 (six) hours as needed (muscle spasms)., Starting 11/02/2012, Until Discontinued, No Print    polyethylene glycol powder (MIRALAX) powder Take 17 g by mouth 2 (two) times daily., Starting 11/02/2012, Until Discontinued, No Print      CONTINUE these medications which have NOT CHANGED   Details  cetirizine (ZYRTEC) 10 MG tablet Take 10 mg by mouth daily., Until Discontinued, Historical Med    loratadine (CLARITIN) 10 MG tablet Take 10 mg by mouth daily., Until Discontinued, Historical Med      STOP taking these medications     ibuprofen (ADVIL,MOTRIN) 200 MG tablet Comments:  Reason for Stopping:           Signed: Anastasio Auerbach. Jehad Bisono   PAC  11/04/2012, 11:33 AM

## 2016-03-11 ENCOUNTER — Ambulatory Visit: Payer: Self-pay | Admitting: Urology

## 2019-03-22 ENCOUNTER — Telehealth: Payer: Self-pay

## 2019-03-22 ENCOUNTER — Other Ambulatory Visit: Payer: No Typology Code available for payment source

## 2019-03-22 DIAGNOSIS — Z20822 Contact with and (suspected) exposure to covid-19: Secondary | ICD-10-CM

## 2019-03-22 NOTE — Telephone Encounter (Signed)
This encounter was created in error - please disregard.

## 2019-03-22 NOTE — Telephone Encounter (Signed)
rec'd call from Dr. Filomena Jungling office, with request to scheduled pt. For COVID test.  Pt. 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  Dr. De Hollingshead office # is [phone], and Fax. [phone].    Phone call to pt.  Scheduled for appt. This afternoon at 2:15 PM @ [facility].  Advised to wear mask and remain in the car for testing.  Verb. Understanding.

## 2019-03-22 NOTE — Telephone Encounter (Signed)
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 He says he has no symptoms and wants to be tested just to make sure. He says he called the [facility] and was told to call his doctor. He says he called his doctor and was told to call us for testing at [facility]. I advised his doctor will need to send a referral and that they can call it in and we will call him to schedule. He asked can he just drive up there, I advised it's by appointment and physician referral only. He says he will call his PCP, advised to have the office call [phone] to refer him for testing.

## 2019-03-23 LAB — NOVEL CORONAVIRUS, NAA: SARS-CoV-2, NAA: NOT DETECTED

## 2021-06-13 ENCOUNTER — Encounter: Payer: Self-pay | Admitting: Emergency Medicine

## 2021-06-13 ENCOUNTER — Emergency Department: Payer: 59

## 2021-06-13 ENCOUNTER — Other Ambulatory Visit: Payer: Self-pay

## 2021-06-13 DIAGNOSIS — Z7982 Long term (current) use of aspirin: Secondary | ICD-10-CM | POA: Diagnosis not present

## 2021-06-13 DIAGNOSIS — K219 Gastro-esophageal reflux disease without esophagitis: Secondary | ICD-10-CM | POA: Diagnosis not present

## 2021-06-13 DIAGNOSIS — Z96642 Presence of left artificial hip joint: Secondary | ICD-10-CM | POA: Insufficient documentation

## 2021-06-13 DIAGNOSIS — R1011 Right upper quadrant pain: Secondary | ICD-10-CM | POA: Insufficient documentation

## 2021-06-13 LAB — URINALYSIS, COMPLETE (UACMP) WITH MICROSCOPIC
Bacteria, UA: NONE SEEN
Bilirubin Urine: NEGATIVE
Glucose, UA: NEGATIVE mg/dL
Hgb urine dipstick: NEGATIVE
Ketones, ur: NEGATIVE mg/dL
Leukocytes,Ua: NEGATIVE
Nitrite: NEGATIVE
Protein, ur: NEGATIVE mg/dL
Specific Gravity, Urine: 1.025 (ref 1.005–1.030)
pH: 7 (ref 5.0–8.0)

## 2021-06-13 LAB — COMPREHENSIVE METABOLIC PANEL
ALT: 15 U/L (ref 0–44)
AST: 19 U/L (ref 15–41)
Albumin: 4.5 g/dL (ref 3.5–5.0)
Alkaline Phosphatase: 60 U/L (ref 38–126)
Anion gap: 6 (ref 5–15)
BUN: 17 mg/dL (ref 6–20)
CO2: 26 mmol/L (ref 22–32)
Calcium: 9.8 mg/dL (ref 8.9–10.3)
Chloride: 104 mmol/L (ref 98–111)
Creatinine, Ser: 0.98 mg/dL (ref 0.61–1.24)
GFR, Estimated: >60 mL/min (ref >60)
Glucose, Bld: 126 mg/dL — ABNORMAL HIGH (ref 70–99)
Potassium: 3.6 mmol/L (ref 3.5–5.1)
Sodium: 136 mmol/L (ref 135–145)
Total Bilirubin: 0.7 mg/dL (ref 0.3–1.2)
Total Protein: 7.4 g/dL (ref 6.5–8.1)

## 2021-06-13 LAB — CBC
HCT: 38.7 % — ABNORMAL LOW (ref 39.0–52.0)
Hemoglobin: 13.7 g/dL (ref 13.0–17.0)
MCH: 31.8 pg (ref 26.0–34.0)
MCHC: 35.4 g/dL (ref 30.0–36.0)
MCV: 89.8 fL (ref 80.0–100.0)
Platelets: 150 10*3/uL (ref 150–400)
RBC: 4.31 MIL/uL (ref 4.22–5.81)
RDW: 12 % (ref 11.5–15.5)
WBC: 5.9 10*3/uL (ref 4.0–10.5)
nRBC: 0 % (ref 0.0–0.2)

## 2021-06-13 LAB — LIPASE, BLOOD: Lipase: 30 U/L (ref 11–51)

## 2021-06-13 LAB — TROPONIN I (HIGH SENSITIVITY): Troponin I (High Sensitivity): 2 ng/L (ref <18)

## 2021-06-13 NOTE — ED Triage Notes (Signed)
Pt presents to ER from home with complaints of RUQ pain and generalized abdominal discomfort for the past 30 minutes. Pt reports shortness of breath, feels pressure "like a contraction " to RUQ area. Pt talks in complete sentences no respiratory distress noted. Pt reports had [place] food about 3 hrs ago.

## 2021-06-14 ENCOUNTER — Emergency Department: Payer: 59

## 2021-06-14 ENCOUNTER — Emergency Department
Admission: EM | Admit: 2021-06-14 | Discharge: 2021-06-14 | Disposition: A | Discharge status: Home / Self Care | Payer: 59 | Attending: Emergency Medicine | Admitting: Emergency Medicine

## 2021-06-14 DIAGNOSIS — R1010 Upper abdominal pain, unspecified: Secondary | ICD-10-CM

## 2021-06-14 LAB — TROPONIN I (HIGH SENSITIVITY): Troponin I (High Sensitivity): 2 ng/L (ref <18)

## 2021-06-14 MED ORDER — SUCRALFATE 1 G PO TABS
1.0000 g | ORAL_TABLET | Freq: Once | ORAL | Status: AC
  Administered 2021-06-14: 1 g via ORAL
  Filled 2021-06-14: qty 1

## 2021-06-14 MED ORDER — SUCRALFATE 1 G PO TABS
1.0000 g | ORAL_TABLET | Freq: Four times a day (QID) | ORAL | 1 refills | Status: AC | PRN
Start: 2021-06-14 — End: ?

## 2021-06-14 MED ORDER — SUCRALFATE 1 G PO TABS: 1.0000 g | ORAL_TABLET | Freq: Four times a day (QID) | ORAL | 1 refills | Status: DC | PRN

## 2021-06-14 NOTE — ED Notes (Signed)
Patient stable and discharged with all personal belongings and AVS. AVS and discharge instructions reviewed with patient and opportunity for questions provided.   

## 2021-06-14 NOTE — Discharge Instructions (Signed)

## 2021-06-14 NOTE — ED Provider Notes (Signed)
[facility] Emergency Department Provider Note  ____________________________________________   Event Date/Time   First MD Initiated Contact with Patient 06/14/21 [phone]     (approximate)  I have reviewed the triage vital signs and the nursing notes.   HISTORY  Chief Complaint Abdominal Pain    HPI Daniel Middleton is a 55 y.o. male who is generally healthy though he does have a history of well-controlled GERD.  He presents for evaluation of a cute onset and severe sharp and aching pain which she describes as in the middle and right upper part of his abdomen "just below his ribs".  It occurred about 3 hours after eating some [place] food.  He has had no nausea nor vomiting and no diarrhea.  No lower abdominal pain.  It has been severe pain that occasionally radiates through to his back and nothing in particular makes it better or worse.  He denies fever, sore throat, chest pain, shortness of breath as well as nausea/vomiting/diarrhea.  Of note, the patient reports that he has had similar but much milder symptoms several times over the last few months but it always goes away quickly and he has not been thinking about it very much.  He was told by his doctor it might be gas but he does not feel like he has gas tonight.  He has never had abdominal surgery other than a left inguinal hernia repair.  He said the pain is much better now than it used to be, he just feels a mild persistent ache.     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 Authorizing Provider  sucralfate (CARAFATE) 1 g tablet Take 1 tablet (1 g total) by mouth 4 (four) times daily as needed (for abdominal discomfort, nausea, and/or vomiting). 06/14/21  Yes Loleta Rose, MD  aspirin EC 325 MG tablet Take 1 tablet (325 mg total) by mouth 2 (two) times daily. 11/02/12   Lanney Gins, PA-C  cetirizine (ZYRTEC) 10 MG tablet Take 10 mg by mouth daily.    [provider]  docusate sodium (COLACE) 100 MG capsule Take 1 capsule (100 mg total) by mouth 2 (two) times daily. 11/02/12   Lanney Gins, PA-C  ferrous sulfate 325 (65 FE) MG tablet Take 1 tablet (325 mg total) by mouth 3 (three) times daily after meals. 11/02/12   Lanney Gins, PA-C  HYDROcodone-acetaminophen (NORCO) 7.5-325 MG per tablet Take 1-2 tablets by mouth every 4 (four) hours as needed for pain. 11/02/12   Lanney Gins, PA-C  loratadine (CLARITIN) 10 MG tablet Take 10 mg by mouth daily.    [provider]  methocarbamol (ROBAXIN) 500 MG tablet Take 1 tablet (500 mg total) by mouth every 6 (six) hours as needed (muscle spasms). 11/02/12   Lanney Gins, PA-C  polyethylene glycol powder (MIRALAX)  powder Take 17 g by mouth 2 (two) times daily. 11/02/12   Lanney Gins, PA-C    Allergies Codeine  No family history on file.  Social History Social History   Tobacco Use   Smoking status: Never  Substance Use Topics   Alcohol use: Yes    Comment: rare   Drug use: No    Review of Systems Constitutional: No fever/chills Eyes: No visual changes. ENT: No sore throat. Cardiovascular: Denies chest pain. Respiratory: Denies shortness of breath. Gastrointestinal: Acute onset and severe epigastric and right upper quadrant pain.  Denies nausea/vomiting/diarrhea. Genitourinary: Negative for dysuria. Musculoskeletal: Negative for  neck pain.  Negative for back pain. Integumentary: Negative for rash. Neurological: Negative for headaches, focal weakness or numbness.   ____________________________________________   PHYSICAL EXAM:  VITAL SIGNS: ED Triage Vitals  Enc Vitals Group     BP 06/13/21 2201 (!) 133/96     Pulse Rate 06/13/21 2201 76     Resp 06/13/21 2201 16     Temp 06/13/21 2201 97.7 F (36.5 C)     Temp Source 06/13/21 2201 Oral     SpO2 06/13/21 2201 98 %     Weight 06/13/21 2202 95.3 kg (210 lb)     Height 06/13/21 2202 1.905 m (6\' 3" )     Head Circumference --      Peak Flow --      Pain Score 06/13/21 2202 7     Pain Loc --      Pain Edu? --      Excl. in GC? --     Constitutional: Alert and oriented.  Eyes: Conjunctivae are normal.  Head: Atraumatic. Nose: No congestion/rhinnorhea. Mouth/Throat: Patient is wearing a mask. Neck: No stridor.  No meningeal signs.   Cardiovascular: Normal rate, regular rhythm. Good peripheral circulation. Respiratory: Normal respiratory effort.  No retractions. Gastrointestinal: Soft and nondistended.  Mild tenderness to palpation of the epigastrium but significant tenderness to palpation of the right upper quadrant with positive Murphy sign.  No lower abdominal tenderness, no rebound or guarding. Musculoskeletal: No lower extremity tenderness nor edema. No gross deformities of extremities. Neurologic:  Normal speech and language. No gross focal neurologic deficits are appreciated.  Skin:  Skin is warm, dry and intact. Psychiatric: Mood and affect are normal. Speech and behavior are normal.  ____________________________________________   LABS (all labs ordered are listed, but only abnormal results are displayed)  Labs Reviewed  COMPREHENSIVE METABOLIC PANEL - Abnormal; Notable for the following components:      Result Value   Glucose, Bld 126 (*)    All other components within normal limits  CBC - Abnormal; Notable for the following components:    HCT 38.7 (*)    All other components within normal limits  LIPASE, BLOOD  URINALYSIS, COMPLETE (UACMP) WITH MICROSCOPIC  TROPONIN I (HIGH SENSITIVITY)  TROPONIN I (HIGH SENSITIVITY)   ____________________________________________  EKG  ED ECG REPORT I, 2203, the attending physician, personally viewed and interpreted this ECG.  Date: 06/13/2021 EKG Time: 21: 53 Rate: 82 Rhythm: normal sinus rhythm QRS Axis: normal Intervals: normal ST/T Wave abnormalities: Non-specific ST segment / T-wave changes, but no clear evidence of acute ischemia. Narrative Interpretation: no definitive evidence of acute ischemia; does not meet STEMI criteria.  ____________________________________________  RADIOLOGY I, 08/13/2021, personally viewed and evaluated these images (plain radiographs) as part of my medical decision making, as well as reviewing the written report by the radiologist.  ED MD interpretation: No acute  abnormality on chest x-ray.  Ultrasound of the liver and gallbladder shows no evidence of acute abnormality.  Official radiology report(s): DG Chest 2 View  Result Date: 06/13/2021 CLINICAL DATA:  Chest pain. EXAM: CHEST - 2 VIEW COMPARISON:  None. FINDINGS: The heart size and mediastinal contours are within normal limits. Both lungs are clear. The visualized skeletal structures are unremarkable. IMPRESSION: No active cardiopulmonary disease. Electronically Signed   By: Elgie Collard M.D.   On: 06/13/2021 22:57   US ABDOMEN LIMITED RUQ (LIVER/GB)  Result Date: 06/14/2021 CLINICAL DATA:  Right upper quadrant abdominal pain. EXAM: ULTRASOUND ABDOMEN LIMITED RIGHT UPPER QUADRANT COMPARISON:  None. FINDINGS: Gallbladder: No gallstones or wall thickening visualized. No sonographic Murphy sign noted by sonographer. Common bile duct: Diameter: 5 mm Liver: No focal lesion identified. Within normal limits in parenchymal echogenicity. Portal vein is patent on color Doppler imaging with  normal direction of blood flow towards the liver. Other: None. IMPRESSION: Unremarkable right upper quadrant ultrasound. Electronically Signed   By: Elgie Collard M.D.   On: 06/14/2021 02:14    ____________________________________________   PROCEDURES   Procedure(s) performed (including Critical Care):  Procedures   ____________________________________________   INITIAL IMPRESSION / MDM / ASSESSMENT AND PLAN / ED COURSE  As part of my medical decision making, I reviewed the following data within the electronic MEDICAL RECORD NUMBER Nursing notes reviewed and incorporated, Labs reviewed , EKG interpreted , Old chart reviewed, and Notes from prior ED visits   Differential diagnosis includes, but is not limited to, biliary colic, pancreatitis, GERD, pneumonia, ACS.  No indication of ischemia on EKG.  Vital signs are stable and within normal limits.  Patient feels much better now than he did earlier.  Given the description of the symptoms and his history, I strongly suspect gallstones with biliary colic.  However his comprehensive metabolic panel and lipase are all within normal limits including no transaminitis and I doubt he will have cholecystitis.  Urinalysis is normal, CBC is normal with no leukocytosis.  High-sensitivity troponin negative x2.  Highly doubt ACS.  Holding off on additional treatment right now in favor of getting the right upper quadrant ultrasound and then I will reassess.  Anticipate discharge and outpatient follow-up.     Clinical Course as of 06/14/21 0251  Sat Jun 14, 2021  0219 US ABDOMEN LIMITED RUQ (LIVER/GB) Normal right upper quadrant ultrasound.  I will reassess the patient and to determine if additional imaging is necessary. [CF]  0245 Patient said he feels much better.  He said if he moves just right he can still feel some discomfort in his epigastrium but the pain is gone.  Upon reassessment he has no tenderness to palpation and no distention.  I strongly  doubt aortic pathology; the patient has no bruit and no persistent discomfort and his vital signs are very reassuring with a systolic blood pressure around 105.  Patient is comfortable with plan for discharge home.  I will give him a one-time dose of Carafate as well as a prescription for some Carafate at home and I encouraged close outpatient follow-up with his regular doctor.  I gave my usual and customary return precautions and he understands and agrees with the plan. [CF]    Clinical Course User Index [CF] Loleta Rose, MD     ____________________________________________  FINAL CLINICAL IMPRESSION(S) / ED DIAGNOSES  Final diagnoses:  Upper abdominal pain     MEDICATIONS GIVEN DURING THIS VISIT:  Medications  sucralfate (CARAFATE) tablet 1  g (1 g Oral Given 06/14/21 0250)     ED Discharge Orders          Ordered    sucralfate (CARAFATE) 1 g tablet  4 times daily PRN        06/14/21 0250             Note:  This document was prepared using Dragon voice recognition software and may include unintentional dictation errors.   Loleta RoseForbach, Josede Cicero, MD 06/14/21 [phone]

## 2021-12-31 ENCOUNTER — Encounter: Payer: Self-pay | Admitting: Emergency Medicine

## 2021-12-31 ENCOUNTER — Other Ambulatory Visit: Payer: Self-pay

## 2021-12-31 ENCOUNTER — Ambulatory Visit
Admission: EM | Admit: 2021-12-31 | Discharge: 2021-12-31 | Disposition: A | Discharge status: Home / Self Care | Payer: Managed Care, Other (non HMO)

## 2021-12-31 DIAGNOSIS — J0100 Acute maxillary sinusitis, unspecified: Secondary | ICD-10-CM

## 2021-12-31 MED ORDER — AMOXICILLIN 875 MG PO TABS: 875.0000 mg | ORAL_TABLET | Freq: Two times a day (BID) | ORAL | 0 refills | Status: AC

## 2021-12-31 NOTE — ED Provider Notes (Signed)
?[facility] ? ? ? ?CSN: [id number] ?Arrival date & time: 12/31/21  1059 ? ? ?  ? ?History   ?Chief Complaint ?Chief Complaint  ?Patient presents with  ? Cough  ? Sinus Pressure   ? ? ?HPI ?LARRY ALCOCK is a 56 y.o. male.  Patient presents with 5-day history of sinus congestion, postnasal drip, runny nose, scratchy throat, cough.  Symptoms are getting worse.  He denies fever, chills, rash, shortness of breath, or other symptoms.  Treatment at home with OTC sinus medication without relief.  He reports remote history of recurrent sinusitis; none recently. ? ?The history is provided by the patient.  ? ?Past Medical History:  ?Diagnosis Date  ? Arthritis   ? Difficulty sleeping   ? GERD (gastroesophageal reflux disease)   ? PONV (postoperative nausea and vomiting)   ? Sinus trouble   ? ? ?Patient Active Problem List  ? Diagnosis Date Noted  ? 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 ? ?Past Surgical History:  ?Procedure Laterality Date  ? FINGER SURGERY    ? HERNIA REPAIR    ? TOTAL HIP ARTHROPLASTY  11/01/2012  ? Procedure: TOTAL HIP ARTHROPLASTY ANTERIOR APPROACH;  Surgeon: Shelda Pal, MD;  Location: [facility];  Service: Orthopedics;  Laterality: Left;  ? ? ? ? ? ?Home Medications   ? ?Prior to Admission medications   ?Medication Sig Start Date End Date Taking? Authorizing Provider  ?amoxicillin (AMOXIL) 875 MG tablet Take 1 tablet (875 mg total) by mouth 2 (two) times daily for 10 days. 12/31/21 01/10/22 Yes Mickie Bail, NP  ?aspirin EC 325 MG tablet Take 1 tablet (325 mg total) by mouth 2 (two) times daily. 11/02/12   Lanney Gins, PA-C  ?cetirizine (ZYRTEC) 10 MG tablet Take 10 mg by mouth daily.    [provider]  ?docusate sodium (COLACE) 100 MG capsule Take 1 capsule (100 mg total) by mouth 2 (two) times daily. 11/02/12   Lanney Gins, PA-C  ?ferrous sulfate 325 (65 FE) MG tablet Take 1 tablet (325 mg total) by mouth 3 (three) times daily after meals. 11/02/12   Lanney Gins, PA-C   ?HYDROcodone-acetaminophen (NORCO) 7.5-325 MG per tablet Take 1-2 tablets by mouth every 4 (four) hours as needed for pain. 11/02/12   Lanney Gins, PA-C  ?L-Lysine 1000 MG TABS lysine 1,000 mg tablet    [provider]  ?loratadine (CLARITIN) 10 MG tablet Take 10 mg by mouth daily.    [provider]  ?methocarbamol (ROBAXIN) 500 MG tablet Take 1 tablet (500 mg total) by mouth every 6 (six) hours as needed (muscle spasms). 11/02/12   Lanney Gins, PA-C  ?omeprazole (PRILOSEC) 40 MG capsule Take 40 mg by mouth 2 (two) times daily. 10/13/21   [provider]  ?polyethylene glycol powder (MIRALAX) powder Take 17 g by mouth 2 (two) times daily. 11/02/12   Lanney Gins, PA-C  ?sucralfate (CARAFATE) 1 g tablet Take 1 tablet (1 g total) by mouth 4 (four) times daily as needed (for abdominal discomfort, nausea, and/or vomiting). 06/14/21   Loleta Rose, MD  ? ? ?Family History ?History reviewed. No pertinent family history. ? ?Social History ?Social History  ? ?Tobacco Use  ? Smoking status: Never  ? Smokeless tobacco: Never  ?Vaping Use  ? Vaping Use: Never used  ?Substance Use Topics  ? Alcohol use: Yes  ?  Comment: rare  ? Drug use: No  ? ? ? ?Allergies   ?Codeine and Hydrocodone ? ? ?  Review of Systems ?Review of Systems  ?Constitutional:  Negative for chills and fever.  ?HENT:  Positive for congestion, postnasal drip, rhinorrhea, sinus pressure and sore throat. Negative for ear pain.   ?Respiratory:  Positive for cough. Negative for shortness of breath.   ?Cardiovascular:  Negative for chest pain and palpitations.  ?Gastrointestinal:  Negative for diarrhea and vomiting.  ?Skin:  Negative for color change and rash.  ?All other systems reviewed and are negative. ? ? ?Physical Exam ?Triage Vital Signs ?ED Triage Vitals  ?Enc Vitals Group  ?   BP   ?   Pulse   ?   Resp   ?   Temp   ?   Temp src   ?   SpO2   ?   Weight   ?   Height   ?   Head Circumference   ?   Peak Flow   ?   Pain Score   ?    Pain Loc   ?   Pain Edu?   ?   Excl. in GC?   ? ?No data found. ? ?Updated Vital Signs ?BP 131/77   Pulse 90   Temp 98.2 ?F (36.8 ?C)   Resp 18   SpO2 97%  ? ?Visual Acuity ?Right Eye Distance:   ?Left Eye Distance:   ?Bilateral Distance:   ? ?Right Eye Near:   ?Left Eye Near:    ?Bilateral Near:    ? ?Physical Exam ?Vitals and nursing note reviewed.  ?Constitutional:   ?   General: He is not in acute distress. ?   Appearance: He is well-developed.  ?HENT:  ?   Right Ear: Tympanic membrane normal.  ?   Left Ear: Tympanic membrane normal.  ?   Nose: Congestion and rhinorrhea present.  ?   Mouth/Throat:  ?   Mouth: Mucous membranes are moist.  ?   Pharynx: Oropharynx is clear.  ?Cardiovascular:  ?   Rate and Rhythm: Normal rate and regular rhythm.  ?   Heart sounds: Normal heart sounds.  ?Pulmonary:  ?   Effort: Pulmonary effort is normal. No respiratory distress.  ?   Breath sounds: Normal breath sounds.  ?Musculoskeletal:  ?   Cervical back: Neck supple.  ?Skin: ?   General: Skin is warm and dry.  ?Neurological:  ?   Mental Status: He is alert.  ?Psychiatric:     ?   Mood and Affect: Mood normal.     ?   Behavior: Behavior normal.  ? ? ? ?UC Treatments / Results  ?Labs ?(all labs ordered are listed, but only abnormal results are displayed) ?Labs Reviewed - No data to display ? ?EKG ? ? ?Radiology ?No results found. ? ?Procedures ?Procedures (including critical care time) ? ?Medications Ordered in UC ?Medications - No data to display ? ?Initial Impression / Assessment and Plan / UC Course  ?I have reviewed the triage vital signs and the nursing notes. ? ?Pertinent labs & imaging results that were available during my care of the patient were reviewed by me and considered in my medical decision making (see chart for details). ? ?  ?Acute sinusitis.  Patient has been symptomatic and is getting worse despite OTC treatment.  He reports remote history of recurrent sinusitis.  Treating today with 10-day course of  amoxicillin.  Discussed continuing symptomatic care.  Instructed him to follow-up with his PCP if his symptoms are not improving.  He agrees to plan of care. ? ?  Final Clinical Impressions(s) / UC Diagnoses  ? ?Final diagnoses:  ?Acute non-recurrent maxillary sinusitis  ? ? ? ?Discharge Instructions   ? ?  ?Take the amoxicillin as directed.  Follow up with your primary care provider if your symptoms are not improving.   ? ? ? ? ? ?ED Prescriptions   ? ? Medication Sig Dispense Auth. Provider  ? amoxicillin (AMOXIL) 875 MG tablet Take 1 tablet (875 mg total) by mouth 2 (two) times daily for 10 days. 20 tablet Mickie Bailate, Dawnmarie Breon H, NP  ? ?  ? ?PDMP not reviewed this encounter. ?  ?Mickie Bailate, Keyasha Miah H, NP ?12/31/21 1147 ? ?

## 2021-12-31 NOTE — ED Triage Notes (Signed)
Pt presents with cough, scratchy throat, watery eyes, sinus pressure x 5 days.  ?

## 2021-12-31 NOTE — Discharge Instructions (Addendum)
Take the amoxicillin as directed.  Follow up with your primary care provider if your symptoms are not improving.   ° ° °

## 2022-01-22 IMAGING — US US ABDOMEN LIMITED
1 series · 14 of 25 positions shown · non-contrast
Comparison: None.

CLINICAL DATA: Right upper quadrant abdominal pain.

EXAM:
ULTRASOUND ABDOMEN LIMITED RIGHT UPPER QUADRANT

[Series 1: us abdomen limited ruq (liver/gb) · 14 of 43 slices shown]
[im 1/43]
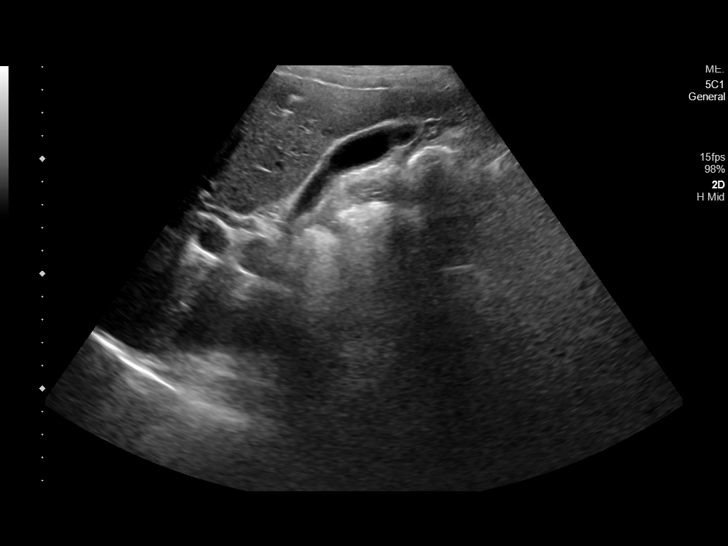
[im 4/43]
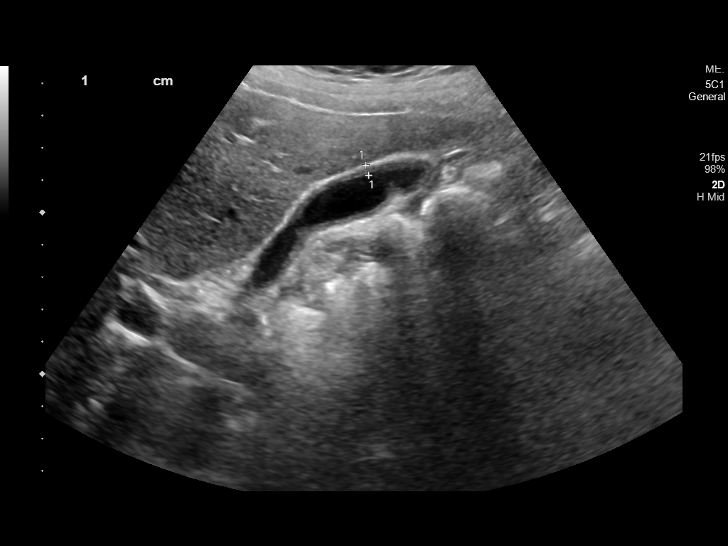
[im 8/43]
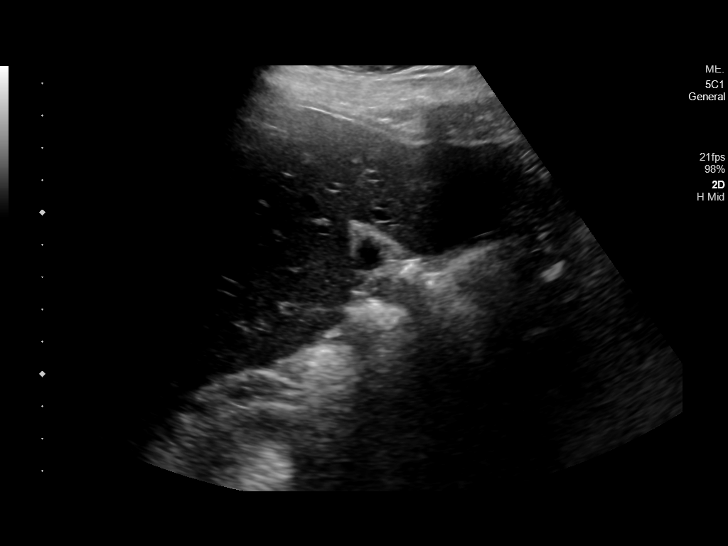
[im 11/43]
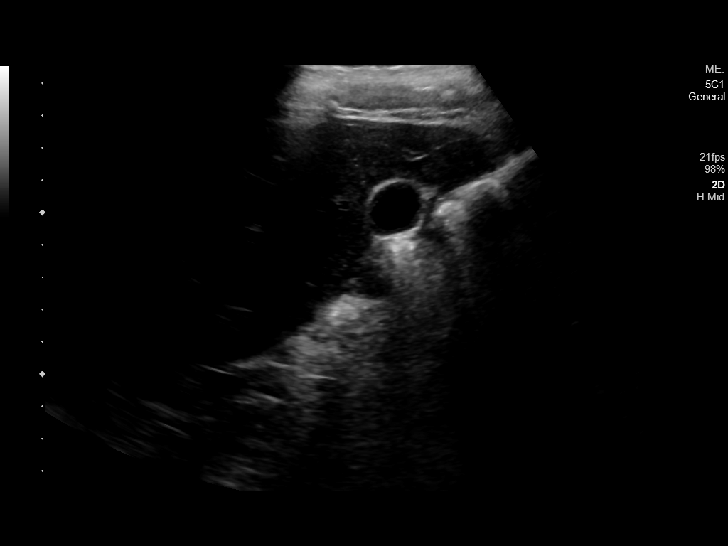
[im 15/43]
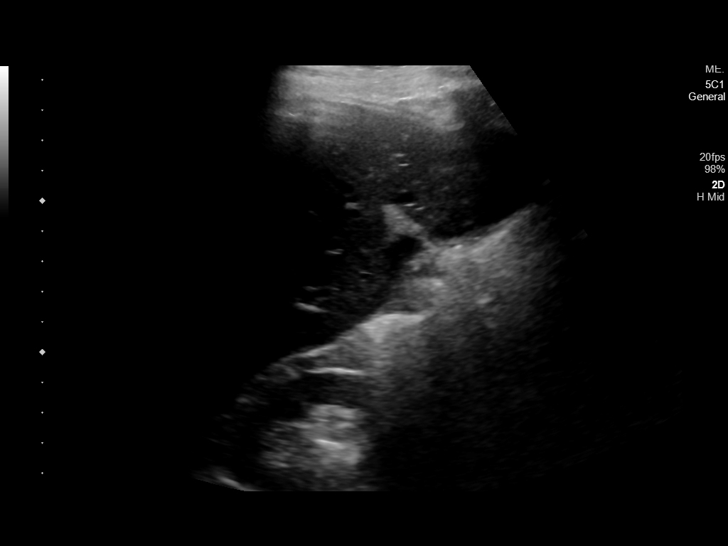
[im 16/43]
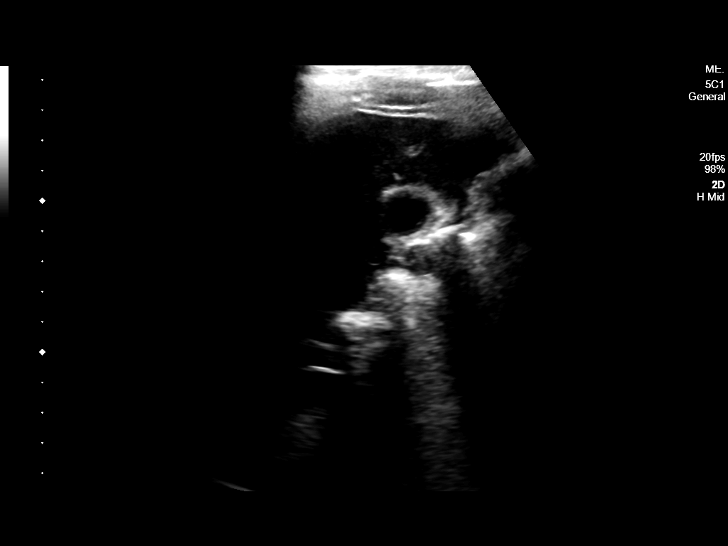
[im 20/43]
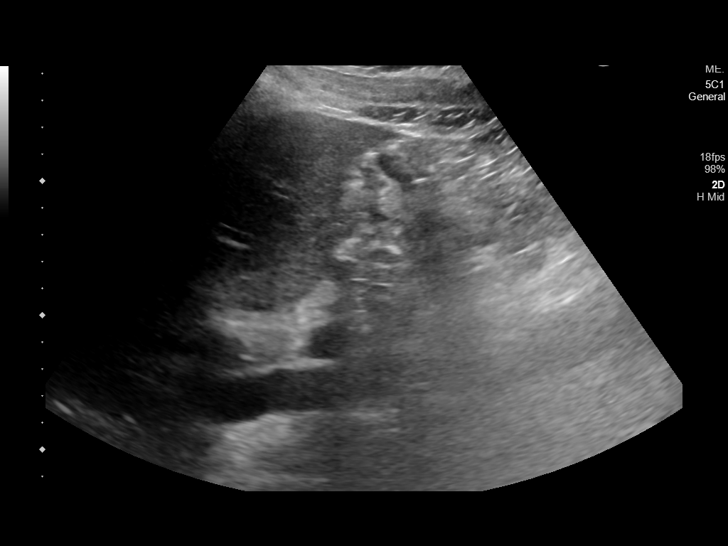
[im 23/43]
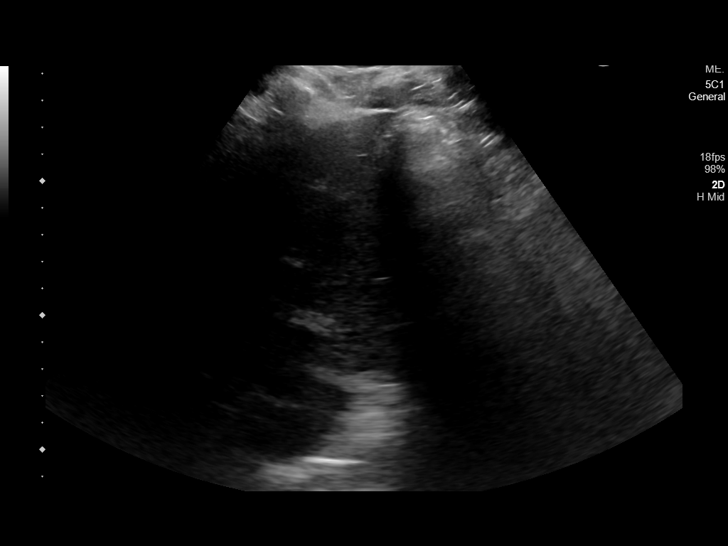
[im 27/43]
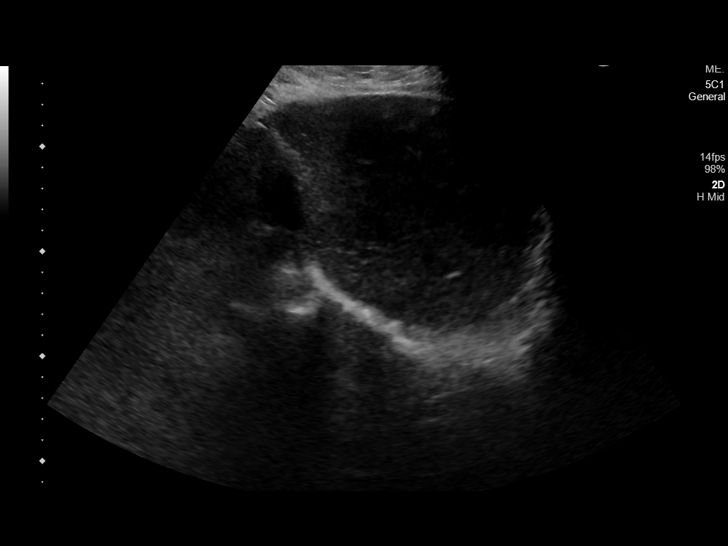
[im 29/43]
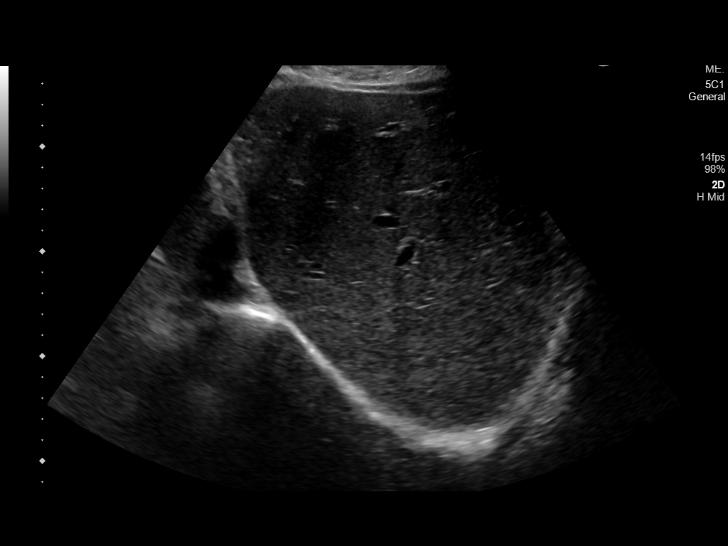
[im 32/43]
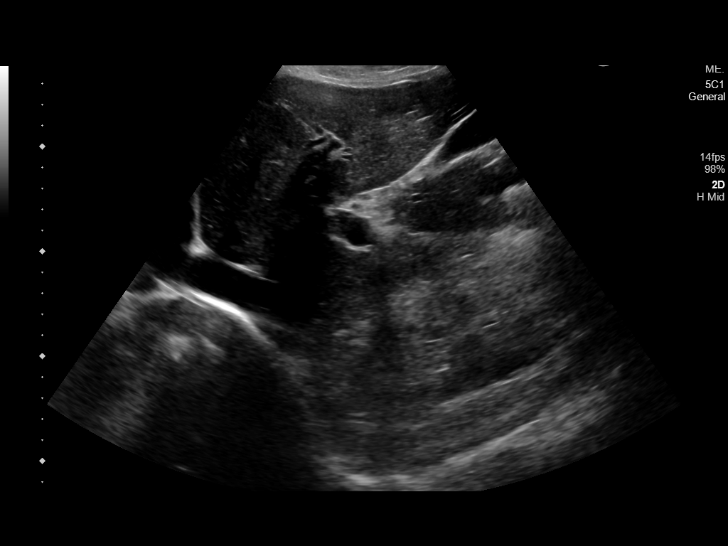
[im 36/43]
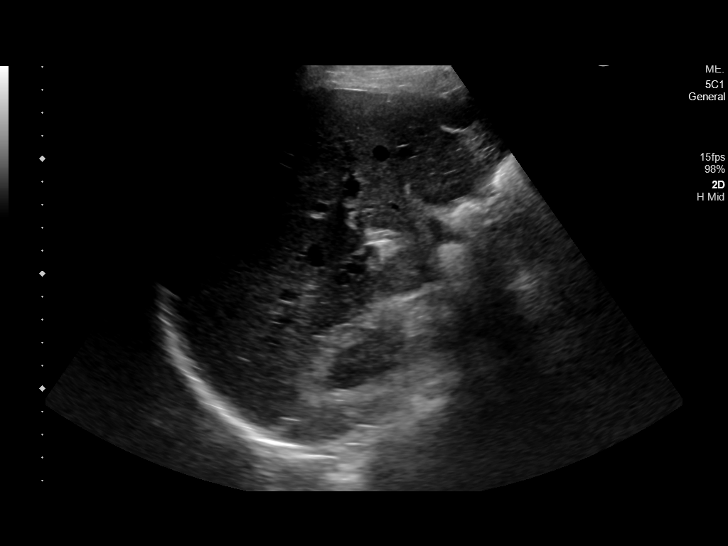
[im 39/43]
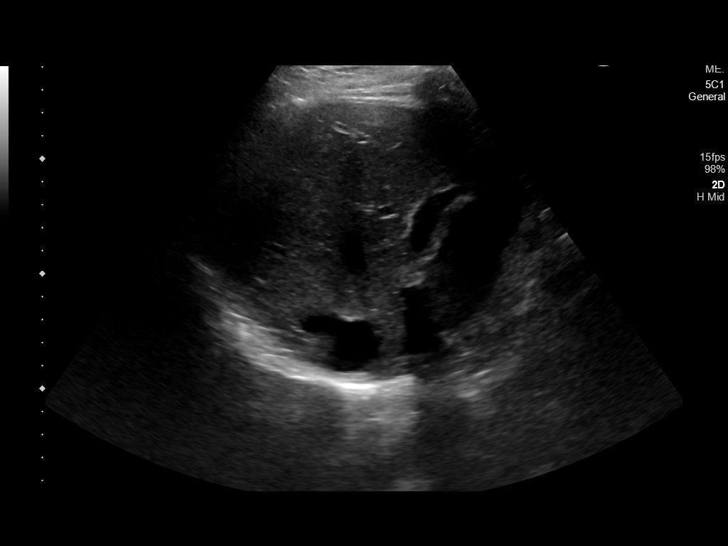
[im 43/43]
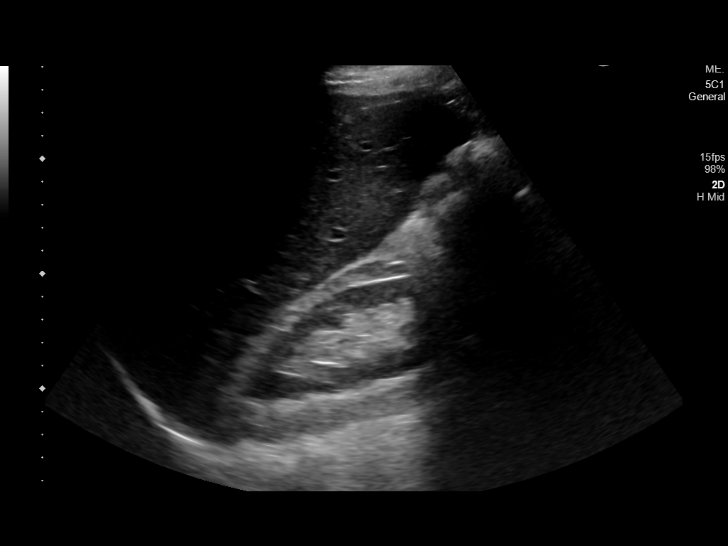

[14 of 25 positions shown; findings below may reference images not displayed]

FINDINGS: Gallbladder:

No gallstones or wall thickening visualized. No sonographic Murphy
sign noted by sonographer.

Common bile duct:

Diameter: 5 mm

Liver:

No focal lesion identified. Within normal limits in parenchymal
echogenicity. Portal vein is patent on color Doppler imaging with
normal direction of blood flow towards the liver.

Other: None.
IMPRESSION: Unremarkable right upper quadrant ultrasound.

## 2022-11-02 ENCOUNTER — Ambulatory Visit: Payer: 59 | Admitting: Dermatology

## 2022-11-02 DIAGNOSIS — L738 Other specified follicular disorders: Secondary | ICD-10-CM

## 2022-11-02 DIAGNOSIS — L578 Other skin changes due to chronic exposure to nonionizing radiation: Secondary | ICD-10-CM | POA: Diagnosis not present

## 2022-11-02 DIAGNOSIS — L570 Actinic keratosis: Secondary | ICD-10-CM

## 2022-11-02 NOTE — Patient Instructions (Signed)
Due to recent changes in healthcare laws, you may see results of your pathology and/or laboratory studies on MyChart before the doctors have had a chance to review them. We understand that in some cases there may be results that are confusing or concerning to you. Please understand that not all results are received at the same time and often the doctors may need to interpret multiple results in order to provide you with the best plan of care or course of treatment. Therefore, we ask that you please give us 2 business days to thoroughly review all your results before contacting the office for clarification. Should we see a critical lab result, you will be contacted sooner.   If You Need Anything After Your Visit  If you have any questions or concerns for your doctor, please call our main line at [phone] and press option 4 to reach your doctor's medical assistant. If no one answers, please leave a voicemail as directed and we will return your call as soon as possible. Messages left after 4 pm will be answered the following business day.   You may also send us a message via MyChart. We typically respond to MyChart messages within 1-2 business days.  For prescription refills, please ask your pharmacy to contact our office. Our fax number is [phone].  If you have an urgent issue when the clinic is closed that cannot wait until the next business day, you can page your doctor at the number below.    Please note that while we do our best to be available for urgent issues outside of office hours, we are not available 24/7.   If you have an urgent issue and are unable to reach us, you may choose to seek medical care at your doctor's office, retail clinic, urgent care center, or emergency room.  If you have a medical emergency, please immediately call 911 or go to the emergency department.  Pager Numbers  - Dr. Kowalski: [phone]  - Dr. Moye: [phone]  - Dr. Stewart:  [phone]  In the event of inclement weather, please call our main line at [phone] for an update on the status of any delays or closures.  Dermatology Medication Tips: Please keep the boxes that topical medications come in in order to help keep track of the instructions about where and how to use these. Pharmacies typically print the medication instructions only on the boxes and not directly on the medication tubes.   If your medication is too expensive, please contact our office at [phone] option 4 or send us a message through MyChart.   We are unable to tell what your co-pay for medications will be in advance as this is different depending on your insurance coverage. However, we may be able to find a substitute medication at lower cost or fill out paperwork to get insurance to cover a needed medication.   If a prior authorization is required to get your medication covered by your insurance company, please allow us 1-2 business days to complete this process.  Drug prices often vary depending on where the prescription is filled and some pharmacies may offer cheaper prices.  The website www.goodrx.com contains coupons for medications through different pharmacies. The prices here do not account for what the cost may be with help from insurance (it may be cheaper with your insurance), but the website can give you the price if you did not use any insurance.  - You can print the associated coupon and take it with   your prescription to the pharmacy.  - You may also stop by our office during regular business hours and pick up a GoodRx coupon card.  - If you need your prescription sent electronically to a different pharmacy, notify our office through [facility] MyChart or by phone at [phone] option 4.     Si Usted Necesita Algo Despus de Su Visita  Tambin puede enviarnos un mensaje a travs de MyChart. Por lo general respondemos a los mensajes de MyChart en el transcurso de 1 a 2  das hbiles.  Para renovar recetas, por favor pida a su farmacia que se ponga en contacto con nuestra oficina. Nuestro nmero de fax es el [phone].  Si tiene un asunto urgente cuando la clnica est cerrada y que no puede esperar hasta el siguiente da hbil, puede llamar/localizar a su doctor(a) al nmero que aparece a continuacin.   Por favor, tenga en cuenta que aunque hacemos todo lo posible para estar disponibles para asuntos urgentes fuera del horario de oficina, no estamos disponibles las 24 horas del da, los 7 das de la semana.   Si tiene un problema urgente y no puede comunicarse con nosotros, puede optar por buscar atencin mdica  en el consultorio de su doctor(a), en una clnica privada, en un centro de atencin urgente o en una sala de emergencias.  Si tiene una emergencia mdica, por favor llame inmediatamente al 911 o vaya a la sala de emergencias.  Nmeros de bper  - Dr. Kowalski: [phone]  - Dra. Moye: [phone]  - Dra. Stewart: [phone]  En caso de inclemencias del tiempo, por favor llame a nuestra lnea principal al [phone] para una actualizacin sobre el estado de cualquier retraso o cierre.  Consejos para la medicacin en dermatologa: Por favor, guarde las cajas en las que vienen los medicamentos de uso tpico para ayudarle a seguir las instrucciones sobre dnde y cmo usarlos. Las farmacias generalmente imprimen las instrucciones del medicamento slo en las cajas y no directamente en los tubos del medicamento.   Si su medicamento es muy caro, por favor, pngase en contacto con nuestra oficina llamando al [phone] y presione la opcin 4 o envenos un mensaje a travs de MyChart.   No podemos decirle cul ser su copago por los medicamentos por adelantado ya que esto es diferente dependiendo de la cobertura de su seguro. Sin embargo, es posible que podamos encontrar un medicamento sustituto a menor costo o llenar un formulario para que el  seguro cubra el medicamento que se considera necesario.   Si se requiere una autorizacin previa para que su compaa de seguros cubra su medicamento, por favor permtanos de 1 a 2 das hbiles para completar este proceso.  Los precios de los medicamentos varan con frecuencia dependiendo del lugar de dnde se surte la receta y alguna farmacias pueden ofrecer precios ms baratos.  El sitio web www.goodrx.com tiene cupones para medicamentos de diferentes farmacias. Los precios aqu no tienen en cuenta lo que podra costar con la ayuda del seguro (puede ser ms barato con su seguro), pero el sitio web puede darle el precio si no utiliz ningn seguro.  - Puede imprimir el cupn correspondiente y llevarlo con su receta a la farmacia.  - Tambin puede pasar por nuestra oficina durante el horario de atencin regular y recoger una tarjeta de cupones de GoodRx.  - Si necesita que su receta se enve electrnicamente a una farmacia diferente, informe a nuestra oficina a travs de MyChart de [facility]   o por telfono llamando al [phone] y presione la opcin 4.  

## 2022-11-02 NOTE — Progress Notes (Signed)
   New Patient Visit  Subjective  Daniel Middleton is a 57 y.o. male who presents for the following: Irregular skin lesions (Around the L eye - has been there for years, white ball comes out, patient concerned and would like checked today). The patient has spots, moles and lesions to be evaluated, some may be new or changing.  The following portions of the chart were reviewed this encounter and updated as appropriate:   Tobacco  Allergies  Meds  Problems  Med Hx  Surg Hx  Fam Hx     Review of Systems:  No other skin or systemic complaints except as noted in HPI or Assessment and Plan.  Objective  Well appearing patient in no apparent distress; mood and affect are within normal limits.  A focused examination was performed including the face. Relevant physical exam findings are noted in the Assessment and Plan.  L infra orbital lat x 1 Erythematous thin papules/macules with gritty scale.    Assessment & Plan  AK (actinic keratosis) L infra orbital lat x 1  Destruction of lesion - L infra orbital lat x 1 Complexity: simple   Destruction method: cryotherapy   Informed consent: discussed and consent obtained   Timeout:  patient name, date of birth, surgical site, and procedure verified Lesion destroyed using liquid nitrogen: Yes   Region frozen until ice ball extended beyond lesion: Yes   Outcome: patient tolerated procedure well with no complications   Post-procedure details: wound care instructions given    Actinic Damage - chronic, secondary to cumulative UV radiation exposure/sun exposure over time - diffuse scaly erythematous macules with underlying dyspigmentation - Recommend daily broad spectrum sunscreen SPF 30+ to sun-exposed areas, reapply every 2 hours as needed.  - Recommend staying in the shade or wearing long sleeves, sun glasses (UVA+UVB protection) and wide brim hats (4-inch brim around the entire circumference of the hat). - Call for new or changing  lesions.  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  Luther Redo, CMA, am acting as scribe for Sarina Ser, MD . Documentation: I have reviewed the above documentation for accuracy and completeness, and I agree with the above.  Sarina Ser, MD

## 2022-11-06 ENCOUNTER — Encounter: Payer: Self-pay | Admitting: Dermatology

## 2022-11-09 DIAGNOSIS — Z96662 Presence of left artificial ankle joint: Secondary | ICD-10-CM | POA: Diagnosis not present

## 2022-11-09 DIAGNOSIS — M19171 Post-traumatic osteoarthritis, right ankle and foot: Secondary | ICD-10-CM | POA: Diagnosis not present

## 2022-11-09 DIAGNOSIS — Z96661 Presence of right artificial ankle joint: Secondary | ICD-10-CM | POA: Diagnosis not present

## 2023-01-17 ENCOUNTER — Emergency Department: Payer: 59

## 2023-01-17 ENCOUNTER — Other Ambulatory Visit: Payer: Self-pay

## 2023-01-17 ENCOUNTER — Emergency Department
Admission: EM | Admit: 2023-01-17 | Discharge: 2023-01-17 | Disposition: A | Discharge status: Home / Self Care | Payer: 59 | Attending: Emergency Medicine | Admitting: Emergency Medicine

## 2023-01-17 DIAGNOSIS — R300 Dysuria: Secondary | ICD-10-CM | POA: Diagnosis not present

## 2023-01-17 DIAGNOSIS — N390 Urinary tract infection, site not specified: Secondary | ICD-10-CM | POA: Insufficient documentation

## 2023-01-17 DIAGNOSIS — R000 Tachycardia, unspecified: Secondary | ICD-10-CM | POA: Diagnosis not present

## 2023-01-17 DIAGNOSIS — R350 Frequency of micturition: Secondary | ICD-10-CM | POA: Diagnosis not present

## 2023-01-17 DIAGNOSIS — R109 Unspecified abdominal pain: Secondary | ICD-10-CM | POA: Diagnosis not present

## 2023-01-17 DIAGNOSIS — R509 Fever, unspecified: Secondary | ICD-10-CM | POA: Diagnosis not present

## 2023-01-17 LAB — URINALYSIS, ROUTINE W REFLEX MICROSCOPIC
Bacteria, UA: NONE SEEN
Bilirubin Urine: NEGATIVE
Glucose, UA: NEGATIVE mg/dL
Ketones, ur: NEGATIVE mg/dL
Nitrite: NEGATIVE
Protein, ur: 100 mg/dL — AB
Specific Gravity, Urine: 1.021 (ref 1.005–1.030)
WBC, UA: >50 WBC/hpf (ref 0–5)
pH: 6 (ref 5.0–8.0)

## 2023-01-17 LAB — CBC
HCT: 39 % (ref 39.0–52.0)
Hemoglobin: 13.4 g/dL (ref 13.0–17.0)
MCH: 31 pg (ref 26.0–34.0)
MCHC: 34.4 g/dL (ref 30.0–36.0)
MCV: 90.3 fL (ref 80.0–100.0)
Platelets: 163 10*3/uL (ref 150–400)
RBC: 4.32 MIL/uL (ref 4.22–5.81)
RDW: 12.3 % (ref 11.5–15.5)
WBC: 16 10*3/uL — ABNORMAL HIGH (ref 4.0–10.5)
nRBC: 0 % (ref 0.0–0.2)

## 2023-01-17 LAB — COMPREHENSIVE METABOLIC PANEL
ALT: 27 U/L (ref 0–44)
AST: 25 U/L (ref 15–41)
Albumin: 4.2 g/dL (ref 3.5–5.0)
Alkaline Phosphatase: 58 U/L (ref 38–126)
Anion gap: 7 (ref 5–15)
BUN: 15 mg/dL (ref 6–20)
CO2: 24 mmol/L (ref 22–32)
Calcium: 9.7 mg/dL (ref 8.9–10.3)
Chloride: 102 mmol/L (ref 98–111)
Creatinine, Ser: 0.96 mg/dL (ref 0.61–1.24)
GFR, Estimated: >60 mL/min (ref >60)
Glucose, Bld: 128 mg/dL — ABNORMAL HIGH (ref 70–99)
Potassium: 3.6 mmol/L (ref 3.5–5.1)
Sodium: 133 mmol/L — ABNORMAL LOW (ref 135–145)
Total Bilirubin: 1.6 mg/dL — ABNORMAL HIGH (ref 0.3–1.2)
Total Protein: 7.2 g/dL (ref 6.5–8.1)

## 2023-01-17 LAB — LACTIC ACID, PLASMA: Lactic Acid, Venous: 0.9 mmol/L (ref 0.5–1.9)

## 2023-01-17 MED ORDER — SODIUM CHLORIDE 0.9 % IV BOLUS
1000.0000 mL | Freq: Once | INTRAVENOUS | Status: AC
  Administered 2023-01-17: 1000 mL via INTRAVENOUS

## 2023-01-17 MED ORDER — CEFPODOXIME PROXETIL 200 MG PO TABS: 200.0000 mg | ORAL_TABLET | Freq: Two times a day (BID) | ORAL | 0 refills | Status: AC

## 2023-01-17 MED ORDER — SODIUM CHLORIDE 0.9 % IV SOLN
1.0000 g | Freq: Once | INTRAVENOUS | Status: AC
  Administered 2023-01-17: 1 g via INTRAVENOUS
  Filled 2023-01-17: qty 10

## 2023-01-17 MED ORDER — MECLIZINE HCL 25 MG PO TABS: 25.0000 mg | ORAL_TABLET | Freq: Three times a day (TID) | ORAL | 0 refills | Status: AC | PRN

## 2023-01-17 NOTE — Discharge Instructions (Addendum)
Take the cefpodoxime as prescribed and finish the full 10-day course.  You may take the meclizine if needed for dizziness.  Follow-up with your primary care doctor.  We have also given you referral to follow-up with a urologist.  Return to the ER for new, worsening, or persistent severe urinary symptoms, blood in the urine, flank or back pain, fevers, weakness or lightheadedness, vomiting or inability to take the medication, or any other new or worsening symptoms that concern you.

## 2023-01-17 NOTE — ED Triage Notes (Signed)
Pt sent from [facility] for possible sepsis. Pt with dysuria, fever, chills and dizziness for several days. [facility] reports concern for possible sepsis.

## 2023-01-17 NOTE — ED Provider Notes (Addendum)
[facility] Provider Note    Event Date/Time   First MD Initiated Contact with Patient 01/17/23 1459     (approximate)   History   Urinary Frequency, Fever, and Dysuria   HPI  Daniel Middleton is a 57 y.o. male with a history of GERD and arthritis who presents with dysuria for the last 2 to 3 days associated with decreased urinary frequency and discoloration of his urine.  The patient reports lower abdominal and bilateral lower back discomfort although no acute pain.  He also noticed a fever today as well as some bodyaches and lightheadedness.  He reports feeling some "brain fog" and also has some shortness of breath.  He denies any cough or chest pain.  He has had no vomiting or diarrhea.  The patient took a dose of azithromycin yesterday given by a friend and then went to the urgent care today where he was instructed to come to the ED for further evaluation and rule out of sepsis.  I reviewed the past medical records.  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  He has no recent ED visits or admissions.   Physical Exam   Triage Vital Signs: ED Triage Vitals  Enc Vitals Group     BP 01/17/23 1419 119/76     Pulse Rate 01/17/23 1419 (!) 103     Resp 01/17/23 1419 18     Temp 01/17/23 1419 98.2 F (36.8 C)     Temp Source 01/17/23 1419 Oral     SpO2 01/17/23 1419 99 %     Weight --      Height --      Head Circumference --      Peak Flow --      Pain Score 01/17/23 1418 5     Pain Loc --      Pain Edu? --      Excl. in GC? --     Most recent vital signs: Vitals:   01/17/23 1500 01/17/23 1530  BP: (!) 102/57 111/78  Pulse: (!) 30 93  Resp: 20 16  Temp:    SpO2: 93% 97%     General: Alert and oriented, somewhat uncomfortable appearing but in no distress.  CV:  Good peripheral perfusion.  Resp:  Normal effort.  Abd:  Soft and nontender.  No distention.   Other:  Slightly dry mucous membranes.   ED Results / Procedures / Treatments   Labs (all labs ordered are listed, but only abnormal results are displayed) Labs Reviewed  COMPREHENSIVE METABOLIC PANEL - Abnormal; Notable for the following components:      Result Value   Sodium 133 (*)    Glucose, Bld 128 (*)    Total Bilirubin 1.6 (*)    All other components within normal limits  CBC - Abnormal; Notable for the following components:   WBC 16.0 (*)    All other components within normal limits  URINALYSIS, ROUTINE W REFLEX MICROSCOPIC - Abnormal; Notable for the following components:   Color, Urine YELLOW (*)    APPearance CLOUDY (*)    Hgb urine dipstick MODERATE (*)    Protein, ur 100 (*)    Leukocytes,Ua LARGE (*)    All other components within normal limits  LACTIC ACID, PLASMA     EKG     RADIOLOGY  Chest x-ray: I independently viewed and interpreted the images; there is no focal  consolidation or edema  CT abdomen/pelvis: No acute abnormalities  PROCEDURES:  Critical Care performed: No  Procedures   MEDICATIONS ORDERED IN ED: Medications  sodium chloride 0.9 % bolus 1,000 mL (0 mLs Intravenous Stopped 01/17/23 1702)  cefTRIAXone (ROCEPHIN) 1 g in sodium chloride 0.9 % 100 mL IVPB (0 g Intravenous Stopped 01/17/23 1702)     IMPRESSION / MDM / ASSESSMENT AND PLAN / ED COURSE  I reviewed the triage vital signs and the nursing notes.  57 year old male with PMH as noted above presents with urinary symptoms and fever over the last few days.  On exam he is overall relatively well-appearing.  He was febrile earlier today but has taken a Tylenol.  His vital signs are currently normal except for borderline low blood pressure.  Differential diagnosis includes, but is not limited to, UTI, other infection such as pneumonia, possible sepsis, kidney stone.  The patient states that he was tested for COVID at the urgent care today and it was negative.  I will not repeat  this test at this time.  Initial labs show WBC count of 16.  Electrolytes and creatinine are normal.  Urinalysis shows significant leukocyte Estrace, WBCs, and some RBCs but no bacteria.  Since this is somewhat equivocal for UTI will obtain a CT to rule out ureteral stone, chest X-ray, give fluids and empiric ceftriaxone, obtain lactate, and reassess.  Patient's presentation is most consistent with acute presentation with potential threat to life or bodily function.  The patient is on the cardiac monitor to evaluate for evidence of arrhythmia and/or significant heart rate changes.   ----------------------------------------- 5:29 PM on 01/17/2023 -----------------------------------------  Lactate is normal.  The patient's vital signs have normalized with fluids.  He has received IV ceftriaxone.  CT shows no concerning acute findings on the chest x-ray is also negative.  At this time, there is no evidence of sepsis.  The patient also has no evidence of pyelonephritis.  I did consider whether he may benefit from inpatient admission given the fevers and systemic symptoms as well as the tachycardia earlier, however given the reassuring workup he is appropriate for outpatient treatment.  The patient himself states that he would strongly prefer to go home.  I have prescribed a 10-day course of cefpodoxime.  The patient also reported some episodic vertigo recently, which he has had before.  Bilateral ear exam is normal.  I have prescribed meclizine for symptomatic treatment.  I have given the patient urology referral.  I gave him strict return precautions and he expressed understanding.  He is stable for discharge at this time.  ----------------------------------------- 5:32 PM on 01/17/2023 -----------------------------------------  I counseled the patient on the CT addendum findings of a liver lesion requiring a nonemergent MRI for follow-up.  The paper discharge instructions have already been  printed, but the patient expressed understanding and will inform his primary care doctor.  FINAL CLINICAL IMPRESSION(S) / ED DIAGNOSES   Final diagnoses:  Lower urinary tract infectious disease     Rx / DC Orders   ED Discharge Orders          Ordered    cefpodoxime (VANTIN) 200 MG tablet  2 times daily        01/17/23 1726    meclizine (ANTIVERT) 25 MG tablet  3 times daily PRN        01/17/23 1726             Note:  This document was prepared using Dragon voice recognition  software and may include unintentional dictation errors.    Dionne Bucy, MD 01/17/23 1730    Dionne Bucy, MD 01/17/23 1732

## 2023-01-17 NOTE — ED Notes (Signed)
Pt to ED with urinary frequency, pain, burning, and producing less and less urine since about 5 days ago. Sent from UC, had fever there and given tylenol (afebrile now). Placed on monitor, urine sent.

## 2023-01-17 NOTE — ED Triage Notes (Addendum)
Pt to ED via POV from [facility]. Pt reports burning with urination, increased urination frequency, fatigue, low back pain and brain fog x1wk. Papers states pt sent over due to meeting SIRS criteria and temp at UC 101.2 and given tylenol.

## 2023-01-17 NOTE — ED Notes (Signed)
Patient to X-ray

## 2023-02-03 ENCOUNTER — Ambulatory Visit: Payer: Managed Care, Other (non HMO) | Admitting: Dermatology

## 2023-05-17 DIAGNOSIS — Z471 Aftercare following joint replacement surgery: Secondary | ICD-10-CM | POA: Diagnosis not present

## 2023-05-17 DIAGNOSIS — Z96661 Presence of right artificial ankle joint: Secondary | ICD-10-CM | POA: Diagnosis not present

## 2023-05-17 DIAGNOSIS — M19071 Primary osteoarthritis, right ankle and foot: Secondary | ICD-10-CM | POA: Diagnosis not present

## 2024-03-20 DIAGNOSIS — Z1331 Encounter for screening for depression: Secondary | ICD-10-CM | POA: Diagnosis not present

## 2024-03-20 DIAGNOSIS — M5481 Occipital neuralgia: Secondary | ICD-10-CM | POA: Diagnosis not present

## 2024-03-20 DIAGNOSIS — M47812 Spondylosis without myelopathy or radiculopathy, cervical region: Secondary | ICD-10-CM | POA: Diagnosis not present

## 2024-03-20 DIAGNOSIS — E538 Deficiency of other specified B group vitamins: Secondary | ICD-10-CM | POA: Diagnosis not present

## 2024-03-27 DIAGNOSIS — E538 Deficiency of other specified B group vitamins: Secondary | ICD-10-CM | POA: Diagnosis not present

## 2024-11-01 ENCOUNTER — Ambulatory Visit: Admitting: Family Medicine
